# Patient Record
Sex: Male | Born: 1975 | Race: White | Hispanic: No | Marital: Single
Health system: Midwestern US, Academic
[De-identification: ages and names within clinical notes are randomized; demographics above are authoritative.]

## PROBLEM LIST (undated history)

## (undated) ENCOUNTER — Emergency Department (HOSPITAL_COMMUNITY): Source: Home / Self Care

## (undated) DIAGNOSIS — N200 Calculus of kidney: Secondary | ICD-10-CM

## (undated) DIAGNOSIS — K219 Gastro-esophageal reflux disease without esophagitis: Secondary | ICD-10-CM

## (undated) DIAGNOSIS — M722 Plantar fascial fibromatosis: Secondary | ICD-10-CM

## (undated) HISTORY — PX: SHOULDER SURGERY: SHX246

## (undated) HISTORY — PX: TONSILLECTOMY: SUR1361

## (undated) HISTORY — DX: Calculus of kidney: N20.0

## (undated) HISTORY — PX: CYSTOSCOPY: SHX5120

## (undated) HISTORY — DX: Gastro-esophageal reflux disease without esophagitis: K21.9

## (undated) HISTORY — DX: Plantar fascial fibromatosis: M72.2

---

## 2006-07-13 ENCOUNTER — Ambulatory Visit (HOSPITAL_COMMUNITY): Admission: RE | Admit: 2006-07-13 | Discharge: 2006-07-13 | Payer: Self-pay | Admitting: Family Medicine

## 2008-02-29 ENCOUNTER — Ambulatory Visit (HOSPITAL_COMMUNITY): Admission: RE | Admit: 2008-02-29 | Discharge: 2008-02-29 | Payer: Self-pay | Admitting: Family Medicine

## 2008-12-16 ENCOUNTER — Ambulatory Visit (HOSPITAL_COMMUNITY): Admission: RE | Admit: 2008-12-16 | Discharge: 2008-12-16 | Payer: Self-pay | Admitting: Family Medicine

## 2011-08-15 ENCOUNTER — Other Ambulatory Visit: Payer: Self-pay

## 2011-08-15 ENCOUNTER — Other Ambulatory Visit: Payer: Self-pay | Admitting: Sports Medicine

## 2011-08-15 DIAGNOSIS — M25522 Pain in left elbow: Secondary | ICD-10-CM

## 2018-09-05 ENCOUNTER — Other Ambulatory Visit: Payer: Self-pay | Admitting: Family Medicine

## 2018-09-05 DIAGNOSIS — M25521 Pain in right elbow: Secondary | ICD-10-CM

## 2018-09-06 ENCOUNTER — Ambulatory Visit
Admission: RE | Admit: 2018-09-06 | Discharge: 2018-09-06 | Disposition: A | Discharge status: Home / Self Care | Payer: Self-pay | Source: Ambulatory Visit | Attending: Family Medicine | Admitting: Family Medicine

## 2018-09-06 DIAGNOSIS — M25521 Pain in right elbow: Secondary | ICD-10-CM

## 2020-06-30 ENCOUNTER — Other Ambulatory Visit: Payer: Self-pay | Admitting: Physician Assistant

## 2020-06-30 ENCOUNTER — Ambulatory Visit (HOSPITAL_COMMUNITY): Payer: Self-pay

## 2020-06-30 ENCOUNTER — Other Ambulatory Visit (HOSPITAL_COMMUNITY): Payer: Self-pay | Admitting: Physician Assistant

## 2020-06-30 DIAGNOSIS — N50811 Right testicular pain: Secondary | ICD-10-CM

## 2020-07-01 ENCOUNTER — Ambulatory Visit (HOSPITAL_COMMUNITY)
Admission: RE | Admit: 2020-07-01 | Discharge: 2020-07-01 | Disposition: A | Discharge status: Home / Self Care | Payer: BC Managed Care – PPO | Source: Ambulatory Visit | Attending: Physician Assistant | Admitting: Physician Assistant

## 2020-07-01 ENCOUNTER — Other Ambulatory Visit: Payer: Self-pay

## 2020-07-01 DIAGNOSIS — N50811 Right testicular pain: Secondary | ICD-10-CM | POA: Insufficient documentation

## 2020-08-05 ENCOUNTER — Ambulatory Visit: Payer: BC Managed Care – PPO | Admitting: Urology

## 2020-08-17 ENCOUNTER — Other Ambulatory Visit: Payer: Self-pay | Admitting: Family Medicine

## 2020-08-17 DIAGNOSIS — R1031 Right lower quadrant pain: Secondary | ICD-10-CM

## 2020-08-20 ENCOUNTER — Ambulatory Visit
Admission: RE | Admit: 2020-08-20 | Discharge: 2020-08-20 | Disposition: A | Discharge status: Home / Self Care | Payer: BC Managed Care – PPO | Source: Ambulatory Visit | Attending: Family Medicine | Admitting: Family Medicine

## 2020-08-20 ENCOUNTER — Other Ambulatory Visit: Payer: Self-pay

## 2020-08-20 DIAGNOSIS — R1031 Right lower quadrant pain: Secondary | ICD-10-CM

## 2020-08-20 MED ORDER — IOPAMIDOL (ISOVUE-300) INJECTION 61%
100.0000 mL | Freq: Once | INTRAVENOUS | Status: AC | PRN
  Administered 2020-08-20: 100 mL via INTRAVENOUS

## 2020-09-09 ENCOUNTER — Other Ambulatory Visit: Payer: Self-pay | Admitting: Family Medicine

## 2020-10-03 ENCOUNTER — Encounter: Admit: 2020-10-03 | Discharge: 2020-10-03 | Payer: PRIVATE HEALTH INSURANCE

## 2020-10-03 ENCOUNTER — Ambulatory Visit: Admit: 2020-10-03 | Discharge: 2020-10-04 | Payer: PRIVATE HEALTH INSURANCE

## 2020-10-03 DIAGNOSIS — S61411A Laceration without foreign body of right hand, initial encounter: Secondary | ICD-10-CM

## 2020-10-03 MED ORDER — AMOXICILLIN-POT CLAVULANATE 875-125 MG PO TAB
1 | ORAL_TABLET | Freq: Two times a day (BID) | ORAL | 0 refills | 7.00000 days | Status: AC
Start: 2020-10-03 — End: ?

## 2020-10-03 MED ORDER — AMOXICILLIN-POT CLAVULANATE 875-125 MG PO TAB
1 | ORAL_TABLET | Freq: Two times a day (BID) | ORAL | 0 refills | 7.00000 days | Status: DC
Start: 2020-10-03 — End: 2020-10-03
  Filled 2020-10-03: qty 20, 10d supply, fill #1

## 2020-10-03 NOTE — Progress Notes
Date of Service: 10/03/2020    Michael Ayala is a 45 y.o. male.  DOB: January 25, 1976  MRN: [id number]     Subjective:             Pt is here today for a laceration to the right hand.  He reports his wine glass broke in his hand last night around 2300.  It caused a laceration to the palmer surface of his right hand over the 5th MCP joint.  Pt is not sure of his las Td.  He treated with a bandage, at it was doing well, but due to physical job, he noted that the cut was not staying together and decided to have it looked at.             Review of Systems   Constitutional: Negative for chills, fatigue and fever.   Respiratory: Negative for cough and chest tightness.    Cardiovascular: Negative for chest pain.   Skin: Positive for wound.         Objective:         No current outpatient medications on file.     Vitals:    10/03/20 1428   BP: 130/80   Pulse: 101   Resp: 14   Temp: 36.8 ?C (98.2 ?F)   SpO2: 98%   Weight: 80 kg (176 lb 6.4 oz)   Height: 180.3 cm (5' 11)   PainSc: Zero     Body mass index is 24.6 kg/m?Marland Kitchen     Physical Exam  Vitals and nursing note reviewed.   Constitutional:       General: He is not in acute distress.     Appearance: Normal appearance. He is normal weight. He is not ill-appearing, toxic-appearing or diaphoretic.   HENT:      Head: Normocephalic and atraumatic.   Pulmonary:      Effort: Pulmonary effort is normal.   Skin:     Capillary Refill: Capillary refill takes less than 2 seconds.      Findings: Laceration present.      Comments: Laceration is in a V shape and about 1 cm on both sides.     Neurological:      Mental Status: He is alert and oriented to person, place, and time.   Psychiatric:         Mood and Affect: Mood normal.         Behavior: Behavior normal.         Thought Content: Thought content normal.         Judgment: Judgment normal.       Discussed with pt that because the laceration is over 12 hour old it cannot be sutured.  However, due to the skin continuing to pull apart with activity, 1 sterri strip placed over the laceration. This was after the area was cleaned with hibiclens.  Pt also given a Td injection.  Due to pt currently traveling and not near a PCP, wait and see antibiotics ordered.  Pt advised to start if he develops signs of infection.  Pt to clean the area BID with soap and water.        Assessment and Plan:  Michael Ayala was seen today for hand laceration.    Diagnoses and all orders for this visit:    Laceration of right hand without foreign body, initial encounter    Other orders  -     TD VACCINE PF >=7YO (DECAVAC, TENIVAC)  -  Discontinue: amoxicillin-potassium clavulanate (AUGMENTIN) 875/125 mg tablet; Take one tablet by mouth every 12 hours. Take with food.  -     amoxicillin-potassium clavulanate (AUGMENTIN) 875/125 mg tablet; Take one tablet by mouth every 12 hours. Take with food.      Patient Instructions     Laceration, All Closures  A?laceration is a cut through the skin. This will usually require stitches or staples if it's deep. Minor cuts may be treated with a surgical tape closure or?skin glue.     Home care  ? Your healthcare provider may prescribe an antibiotic. This is to help prevent infection. Follow all instructions for taking this medicine. Take the medicine every day until it's gone or you are told to stop. You should not have any left over.  ? The healthcare provider may prescribe medicines for pain. If no pain medicines were prescribed, you can use over-the-counter pain medicines. Follow instructions for taking any pain medicines. Talk with your healthcare provider before using these medicines if you have chronic liver or kidney disease, or ever had a stomach ulcer or digestive bleeding.  ? Follow the healthcare provider?s instructions on how to care for the cut.  ? Keep the wound clean and dry. Don't get the wound wet until you are told it's OK to do so.?If the area gets wet, gently pat it dry with a clean cloth. Replace the wet bandage with a dry one.  ? If a bandage was applied and it becomes wet or dirty, replace it. Otherwise, leave it in place for the first 24 hours.  ? Caring for stitches or staples: Once you no longer need to keep them dry, clean the wound daily. First, remove the bandage. Then wash the area gently with soap and clean running water, or as directed by the?healthcare provider. Use a wet cotton swab to loosen and remove any blood or crust that forms. After cleaning, apply a thin layer of antibiotic ointment if advised. Then put on a new bandage unless you are told not to.  ? Caring for skin glue: Don?t put apply liquid, ointment, or cream on the wound while the glue is in place.?Don't do activities that cause heavy sweating. Protect the wound from sunlight.?Don't scratch, rub, or pick at the adhesive film. Don't place tape directly over the film.?The glue should peel off naturally within 5 to 10 days.?  ? Caring for surgical tape: Keep the area dry. If it gets wet, blot it dry with a clean towel. Surgical tape usually falls off within 7 to 10 days. If it has not fallen off after 10 days, you can take it off yourself. Put mineral oil or petroleum jelly on a cotton ball and gently rub the tape until it's removed.  ? Once you can get the wound wet, you may shower as usual but don't soak the wound in water (no tub baths or swimming).  ? Even with proper treatment, a wound infection may sometimes occur. Check the wound daily for signs of infection listed below.  Scalp wounds  During the first 2 days, you may carefully rinse your hair in the shower to remove blood, glass or dirt particles. After 2 days, you may shower and shampoo your hair normally. Don't soak your scalp in the tub or go swimming until the stitches or staples have been removed. Talk with your healthcare provider before applying any antibiotic ointment to the wound.   Mouth wounds  Eat soft foods to reduce pain. If the cut  is inside of your mouth, clean by rinsing after each meal and at bedtime with a mixture of equal parts water and hydrogen peroxide (don't swallow!). Or you can use a cotton swab to directly apply hydrogen peroxide onto the cut. You may also be prescribed a chlorhexidine solution to rinse with. Mouth wounds can be painful when eating. You may use an over-the-counter local numbing solution for pain relief. If this is not available, you may use any numbing solution intended for teething babies. You may apply this directly to the sores with a cotton-tip swab or with your clean finger.   Follow-up care  Follow up with your healthcare provider as advised.?Ask your healthcare provider how long stitches should be left in place. Be sure to return for stitch removal as directed. If dissolving stitches were used in the mouth, these should fall out or dissolve without the need for removal. If tape closures were used, remove them yourself when your provider recommends if they have not fallen off on their own. If?skin glue was used, the film will wear off by itself. Generally, you should keep healing wounds out of direct sunlight for the first couple of months to try to lessen scarring.   When to seek medical advice  Call your healthcare provider right away?if any of these occur:  ? Signs of infection, including increasing pain in the wound, increasing wound redness or swelling, or pus or bad odor coming from the wound  ? Fever of?100.4?F (38.?C)?or higher , or as directed by your healthcare provider  ? Stitches or staples come apart or fall out or surgical tape falls off before 7 days and the wound appears to be reopening  ? Wound edges reopen  ? Wound changes colors  ? Numbness around the wound after any numbing medicine should have worn off  ? Decreased movement around the injured area  Call 911  Call 911?if you can't control the wound bleeding?with direct pressure.   [employer] last reviewed this educational content on 03/08/2018  ? 2000-2021 [employer], [place]. All rights reserved. This information is not intended as a substitute for professional medical care. Always follow your healthcare professional's instructions.

## 2020-10-03 NOTE — Patient Instructions
Laceration, All Closures  Alaceration is a cut through the skin. This will usually require stitches or staples if it's deep. Minor cuts may be treated with a surgical tape closure orskin glue.     Home care   Your healthcare provider may prescribe an antibiotic. This is to help prevent infection. Follow all instructions for taking this medicine. Take the medicine every day until it's gone or you are told to stop. You should not have any left over.   The healthcare provider may prescribe medicines for pain. If no pain medicines were prescribed, you can use over-the-counter pain medicines. Follow instructions for taking any pain medicines. Talk with your healthcare provider before using these medicines if you have chronic liver or kidney disease, or ever had a stomach ulcer or digestive bleeding.   Follow the healthcare provider's instructions on how to care for the cut.   Keep the wound clean and dry. Don't get the wound wet until you are told it's OK to do so.If the area gets wet, gently pat it dry with a clean cloth. Replace the wet bandage with a dry one.   If a bandage was applied and it becomes wet or dirty, replace it. Otherwise, leave it in place for the first 24 hours.   Caring for stitches or staples: Once you no longer need to keep them dry, clean the wound daily. First, remove the bandage. Then wash the area gently with soap and clean running water, or as directed by thehealthcare provider. Use a wet cotton swab to loosen and remove any blood or crust that forms. After cleaning, apply a thin layer of antibiotic ointment if advised. Then put on a new bandage unless you are told not to.   Caring for skin glue: Don't put apply liquid, ointment, or cream on the wound while the glue is in place.Don't do activities that cause heavy sweating. Protect the wound from sunlight.Don't scratch, rub, or pick at the adhesive film. Don't place tape directly over the film.The glue should peel off naturally  within 5 to 10 days.   Caring for surgical tape: Keep the area dry. If it gets wet, blot it dry with a clean towel. Surgical tape usually falls off within 7 to 10 days. If it has not fallen off after 10 days, you can take it off yourself. Put mineral oil or petroleum jelly on a cotton ball and gently rub the tape until it's removed.   Once you can get the wound wet, you may shower as usual but don't soak the wound in water (no tub baths or swimming).   Even with proper treatment, a wound infection may sometimes occur. Check the wound daily for signs of infection listed below.  Scalp wounds  During the first 2 days, you may carefully rinse your hair in the shower to remove blood, glass or dirt particles. After 2 days, you may shower and shampoo your hair normally. Don't soak your scalp in the tub or go swimming until the stitches or staples have been removed. Talk with your healthcare provider before applying any antibiotic ointment to the wound.   Mouth wounds  Eat soft foods to reduce pain. If the cut is inside of your mouth, clean by rinsing after each meal and at bedtime with a mixture of equal parts water and hydrogen peroxide (don't swallow!). Or you can use a cotton swab to directly apply hydrogen peroxide onto the cut. You may also be prescribed a chlorhexidine solution to rinse   with. Mouth wounds can be painful when eating. You may use an over-the-counter local numbing solution for pain relief. If this is not available, you may use any numbing solution intended for teething babies. You may apply this directly to the sores with a cotton-tip swab or with your clean finger.   Follow-up care  Follow up with your healthcare provider as advised.Ask your healthcare provider how long stitches should be left in place. Be sure to return for stitch removal as directed. If dissolving stitches were used in the mouth, these should fall out or dissolve without the need for removal. If tape closures were used, remove  them yourself when your provider recommends if they have not fallen off on their own. Ifskin glue was used, the film will wear off by itself. Generally, you should keep healing wounds out of direct sunlight for the first couple of months to try to lessen scarring.   When to seek medical advice  Call your healthcare provider right awayif any of these occur:   Signs of infection, including increasing pain in the wound, increasing wound redness or swelling, or pus or bad odor coming from the wound   Fever of100.4F (38.C)or higher , or as directed by your healthcare provider   Stitches or staples come apart or fall out or surgical tape falls off before 7 days and the wound appears to be reopening   Wound edges reopen   Wound changes colors   Numbness around the wound after any numbing medicine should have worn off   Decreased movement around the injured area  Call 911  Call 911if you can't control the wound bleedingwith direct pressure.   [employer] last reviewed this educational content on 03/08/2018   2000-2021 [employer]. All rights reserved. This information is not intended as a substitute for professional medical care. Always follow your healthcare professional's instructions.

## 2020-11-05 ENCOUNTER — Other Ambulatory Visit: Payer: Self-pay

## 2020-11-05 ENCOUNTER — Ambulatory Visit: Payer: BC Managed Care – PPO | Admitting: Urology

## 2020-11-05 ENCOUNTER — Encounter: Payer: Self-pay | Admitting: Urology

## 2020-11-05 VITALS — BP 131/91 | HR 97 | Wt 220.0 lb

## 2020-11-05 DIAGNOSIS — Z87448 Personal history of other diseases of urinary system: Secondary | ICD-10-CM | POA: Diagnosis not present

## 2020-11-05 DIAGNOSIS — N5082 Scrotal pain: Secondary | ICD-10-CM | POA: Diagnosis not present

## 2020-11-05 DIAGNOSIS — Z87898 Personal history of other specified conditions: Secondary | ICD-10-CM

## 2020-11-05 DIAGNOSIS — N50819 Testicular pain, unspecified: Secondary | ICD-10-CM

## 2020-11-05 LAB — URINALYSIS, COMPLETE
Bilirubin, UA: NEGATIVE
Glucose, UA: NEGATIVE
Ketones, UA: NEGATIVE
Leukocytes,UA: NEGATIVE
Nitrite, UA: NEGATIVE
Protein,UA: NEGATIVE
RBC, UA: NEGATIVE
Specific Gravity, UA: 1.015 (ref 1.005–1.030)
Urobilinogen, Ur: 0.2 mg/dL (ref 0.2–1.0)
pH, UA: 7 (ref 5.0–7.5)

## 2020-11-05 LAB — MICROSCOPIC EXAMINATION
Bacteria, UA: NONE SEEN
Epithelial Cells (non renal): NONE SEEN /hpf (ref 0–10)
RBC: NONE SEEN /hpf (ref 0–2)

## 2020-11-05 MED ORDER — TAMSULOSIN HCL 0.4 MG PO CAPS: 0.4000 mg | ORAL_CAPSULE | Freq: Every day | ORAL | 0 refills | Status: DC

## 2020-11-06 ENCOUNTER — Encounter: Payer: Self-pay | Admitting: Urology

## 2020-11-06 NOTE — Progress Notes (Signed)
11/05/2020 7:25 AM   Juan Murphy March 13, 1976 [id number]  Referring provider: Curlene Labrum, MD [place],  [place] [address]  Chief Complaint  Patient presents with  . Testicle Pain    HPI: Juan Murphy is a 45 y.o. male referred for a second opinion of chronic right hemiscrotal pain.   Initially saw Dr. Luetta Nutting at [facility] in [facility] 09/10/2020 with an ~ 1 year history of intermittent right hemiscrotal pain  Prior scrotal ultrasound November 2021 showed no intratesticular abnormalities and a small right epididymal cyst (2 mm)  CT performed January 2022 showed a nonobstructing 7 mm left lower pole calculus  Urinalysis was unremarkable and he was treated with an empiric 10-day course of Levaquin and Voltaren/hydrocodone  Mild improvement in symptoms on follow-up and amitriptyline 25 mg daily was added  He continues to have intermittent pain which typically is worse in the evening and he has difficulty sleeping he complains of frequency, urgency.  He did have an episode of gross hematuria approximately 3 weeks before seeing Dr. Luetta Nutting  His most bothersome symptom at present is loss of sleep due to his urinary symptoms and pain  No side effects with the amitriptyline   PMH: History reviewed. No pertinent past medical history.  Surgical History: History reviewed. No pertinent surgical history.  Home Medications:  Allergies as of 11/05/2020   No Known Allergies     Medication List       Accurate as of November 05, 2020 11:59 PM. If you have any questions, ask your nurse or doctor.        tamsulosin 0.4 MG Caps capsule Commonly known as: FLOMAX Take 1 capsule (0.4 mg total) by mouth daily. Started by: Abbie Sons, MD       Allergies: No Known Allergies  Family History: History reviewed. No pertinent family history.  Social History:  reports that he has never smoked. He has never used smokeless tobacco. No history on  file for alcohol use and drug use.   Physical Exam: BP (!) 131/91   Pulse 97   Wt 220 lb (99.8 kg)   Constitutional:  Alert and oriented, No acute distress. HEENT: [place] AT, moist mucus membranes.  Trachea midline, no masses. Cardiovascular: No clubbing, cyanosis, or edema. Respiratory: Normal respiratory effort, no increased work of breathing. GI: Abdomen is soft, nontender, nondistended, no abdominal masses GU: Phallus without lesions, testes descended bilaterally without masses or tenderness.  Spermatic cord/epididymis palpably normal bilaterally.  Prostate 30 g moderately tender and significant pelvic floor tenderness Skin: No rashes, bruises or suspicious lesions. Neurologic: Grossly intact, no focal deficits, moving all 4 extremities. Psychiatric: Normal mood and affect.   Pertinent Imaging: Ultrasound and CT images were personally reviewed and interpreted   Assessment & Plan:    1.  Chronic right hemiscrotal pain  Pain is associated with bothersome lower urinary tract symptoms  Significant pelvic floor tenderness  We discussed possibility of referred pain due to prostatic inflammation and pelvic floor abnormalities  We will add tamsulosin 0.4 mg daily  Increase amitriptyline to 50 mg x 1 week to see if pain/sleep improves  If no improvement in approximately 2 weeks he was instructed to call back and would recommend referral for pelvic floor physical therapy  2.  History gross hematuria  Recommend cystoscopy to assess for stricture or other potential causes of referred testicular pain in addition to a complete lower tract evaluation for other etiologies of hematuria  Abbie Sons, [place] [address] [place], [place] [address] [phone]

## 2021-07-23 IMAGING — US US SCROTUM W/ DOPPLER COMPLETE
1 series · 14 of 25 positions shown · non-contrast
Comparison: Prior exam on time line of 08/10/2018 is not available
for comparison

CLINICAL DATA: RIGHT testicular pain for 2 weeks

EXAM:
SCROTAL ULTRASOUND
DOPPLER ULTRASOUND OF THE TESTICLES
TECHNIQUE: Complete ultrasound examination of the testicles, epididymis, and
other scrotal structures was performed. Color and spectral Doppler
ultrasound were also utilized to evaluate blood flow to the
testicles.

[Series 1: us scrotum w/doppler · 14 of 66 slices shown]
[im 1/66]
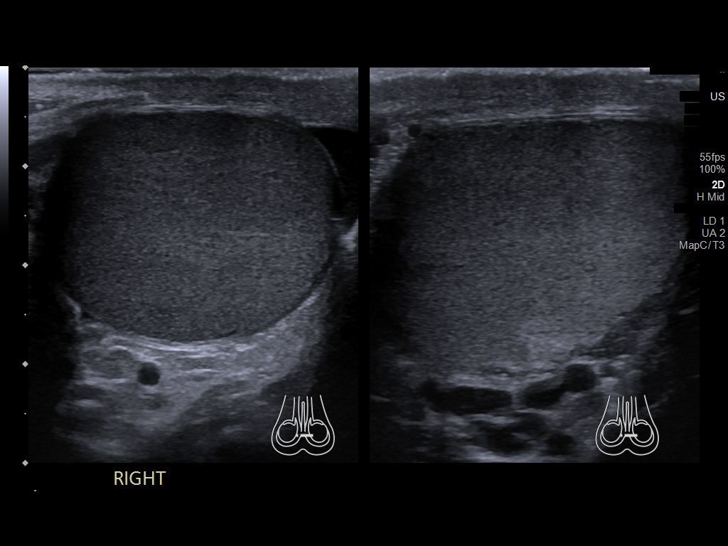
[im 6/66]
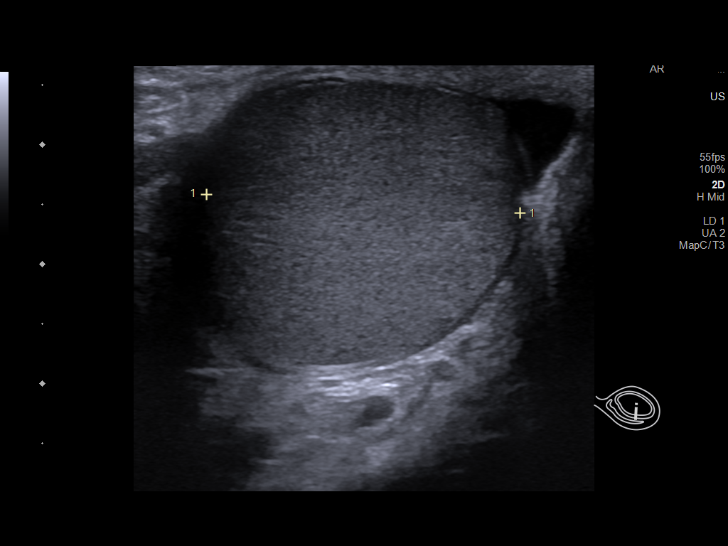
[im 11/66]
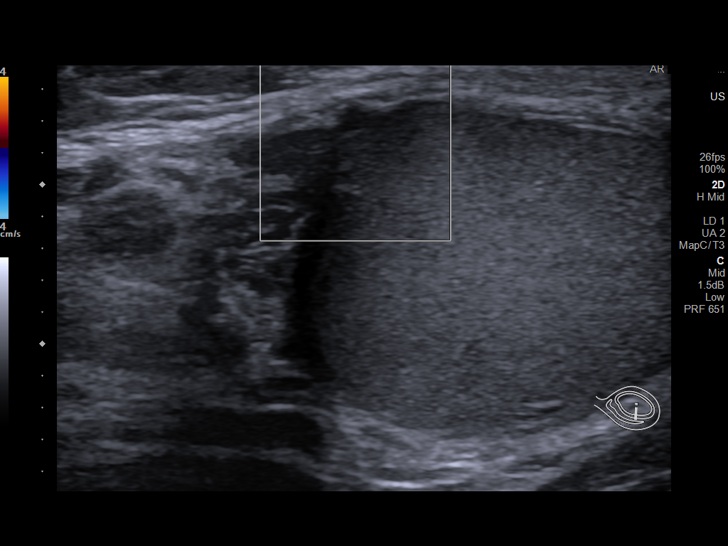
[im 17/66]
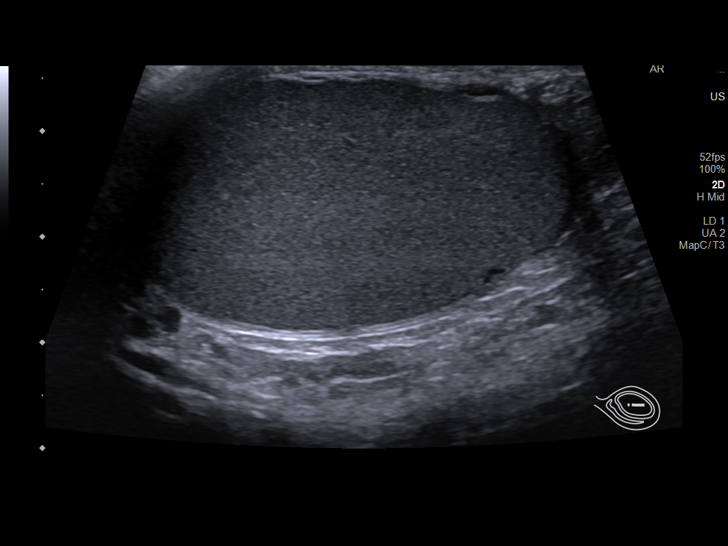
[im 22/66]
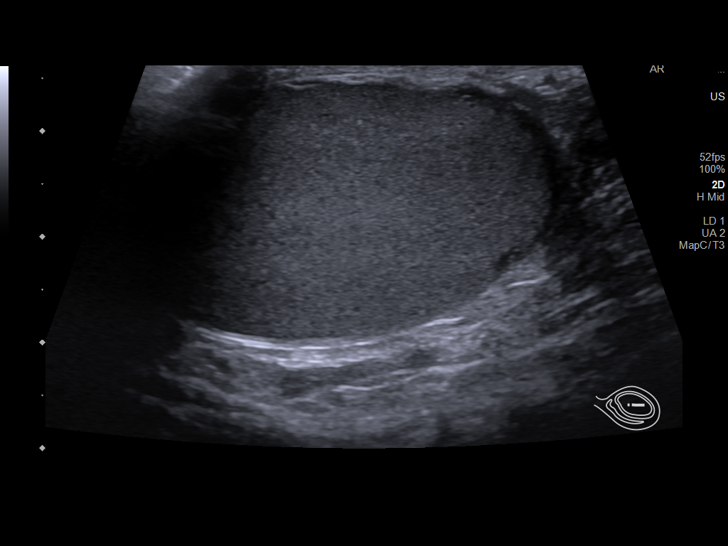
[im 25/66]
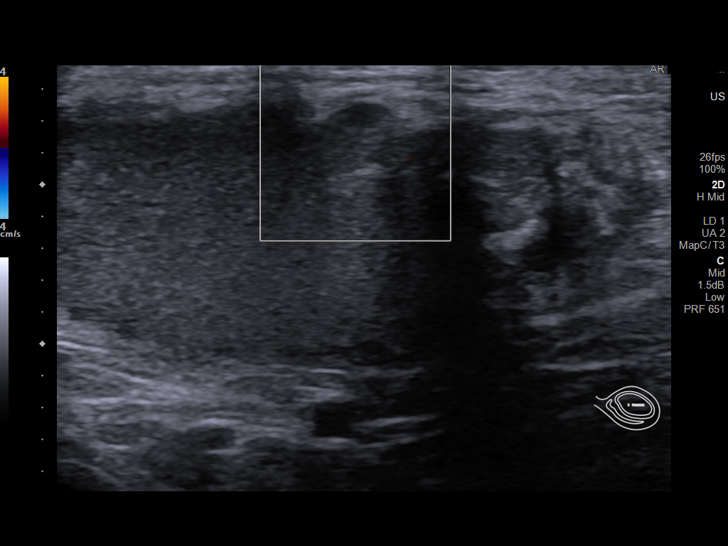
[im 30/66]
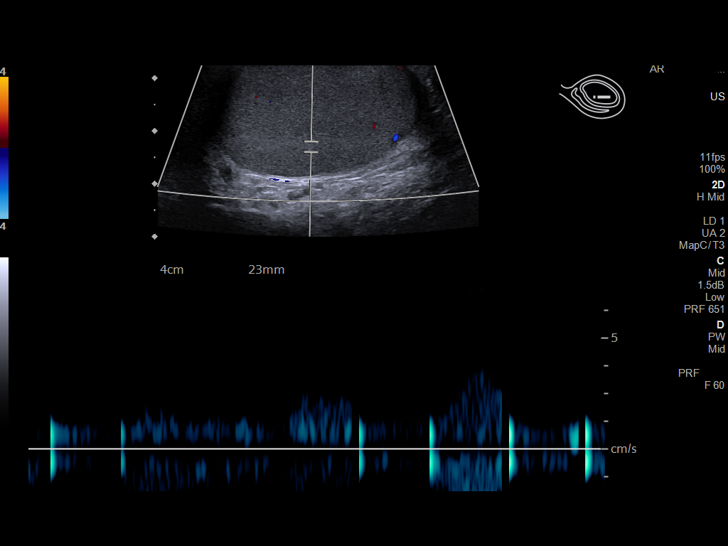
[im 36/66]
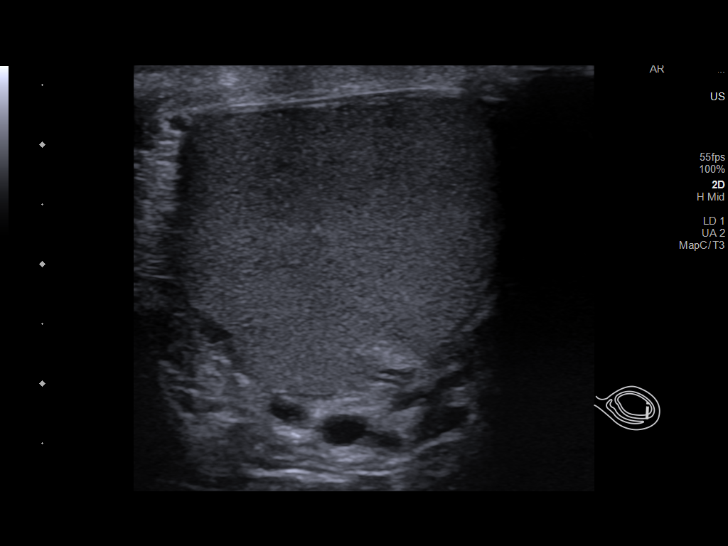
[im 41/66]
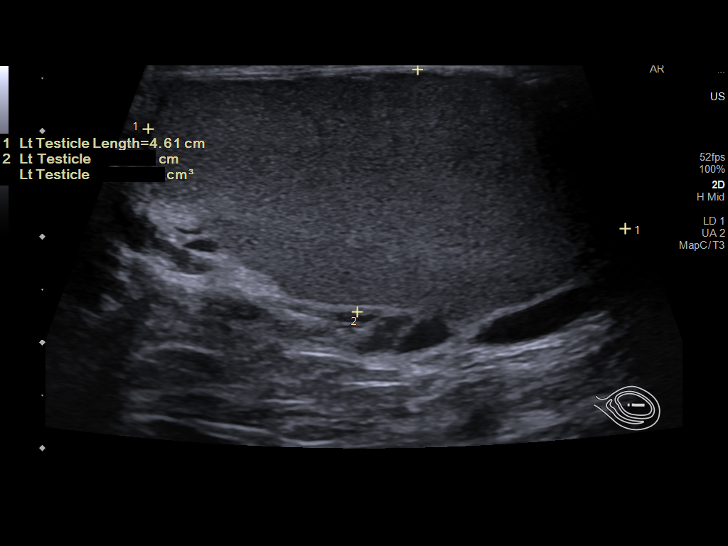
[im 44/66]
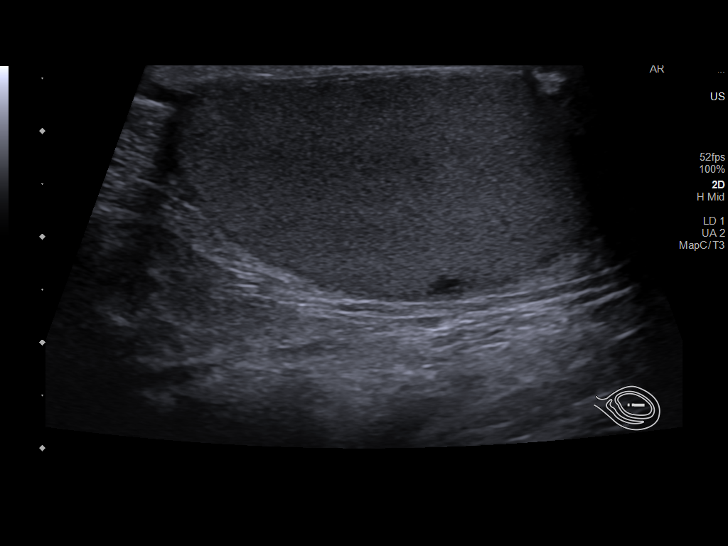
[im 49/66]
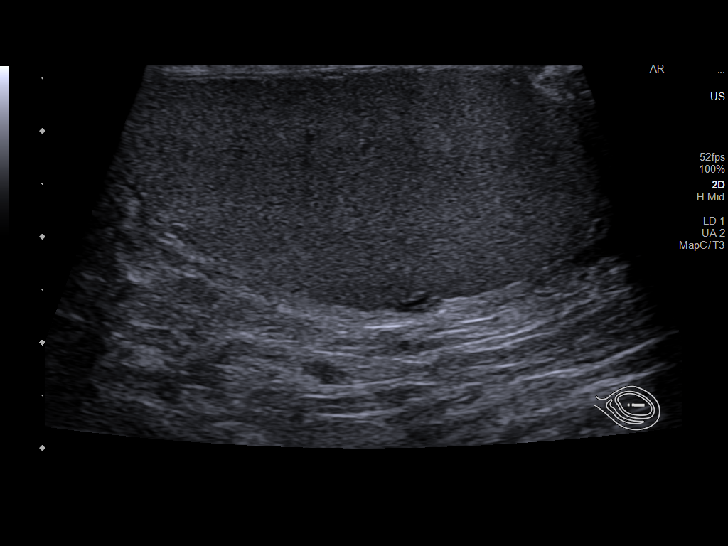
[im 55/66]
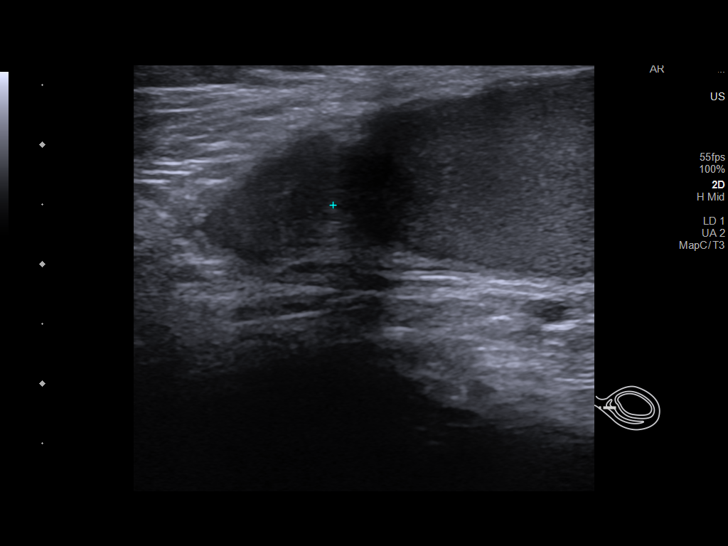
[im 60/66]
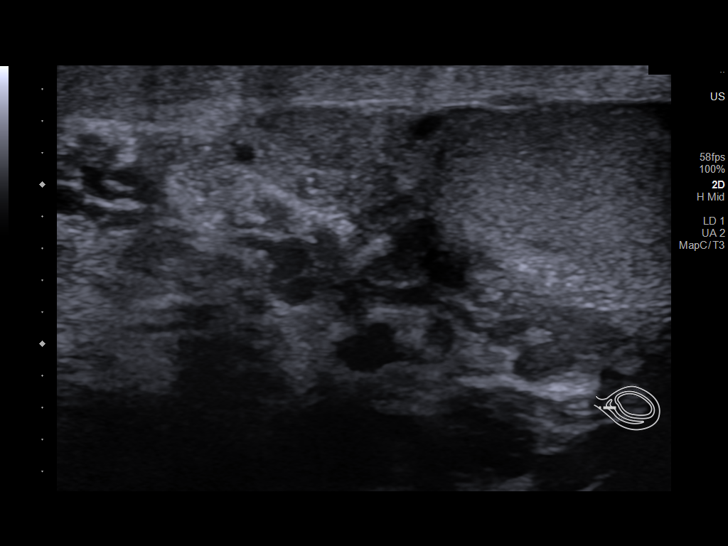
[im 66/66]
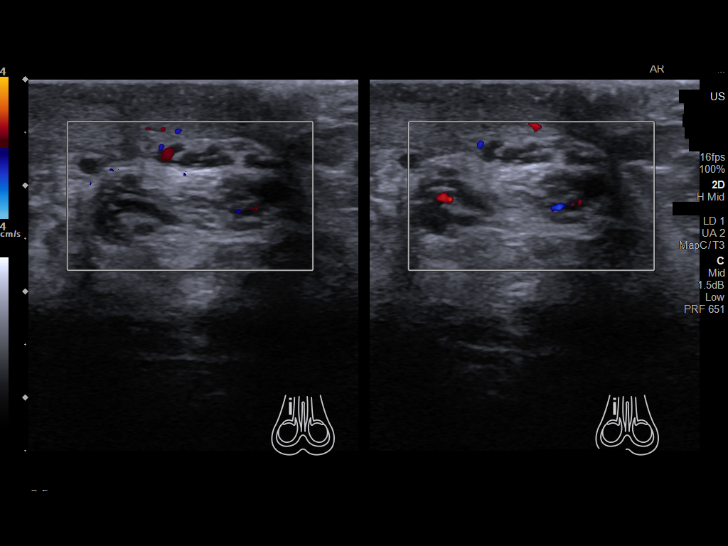

[14 of 25 positions shown; findings below may reference images not displayed]

FINDINGS: Right testicle

Measurements: 4.1 x 2.4 x 2.6 cm. Normal echogenicity without mass
or calcification. Internal blood flow present on color Doppler
imaging.

Left testicle

Measurements: 4.6 x 2.4 x 2.8 cm. Normal echogenicity without mass
or calcification. Internal blood flow present on color Doppler
imaging.

Right epididymis:  Normal in size and appearance.

Left epididymis:  2 mm cyst at LEFT epididymal head

Hydrocele:  Trace RIGHT hydrocele.  No LEFT hydrocele.

Varicocele:  None visualized.

Pulsed Doppler interrogation of both testes demonstrates normal low
resistance arterial and venous waveforms bilaterally.
IMPRESSION: No significant scrotal sonographic abnormalities.

## 2021-11-02 ENCOUNTER — Encounter (INDEPENDENT_AMBULATORY_CARE_PROVIDER_SITE_OTHER): Payer: Self-pay | Admitting: *Deleted

## 2022-01-12 ENCOUNTER — Encounter (INDEPENDENT_AMBULATORY_CARE_PROVIDER_SITE_OTHER): Payer: Self-pay | Admitting: *Deleted

## 2022-01-31 ENCOUNTER — Ambulatory Visit (INDEPENDENT_AMBULATORY_CARE_PROVIDER_SITE_OTHER): Payer: BC Managed Care – PPO | Admitting: Gastroenterology

## 2022-03-08 ENCOUNTER — Encounter: Payer: Self-pay | Admitting: Urology

## 2022-03-08 ENCOUNTER — Ambulatory Visit (HOSPITAL_COMMUNITY)
Admission: RE | Admit: 2022-03-08 | Discharge: 2022-03-08 | Disposition: A | Discharge status: Home / Self Care | Payer: BC Managed Care – PPO | Source: Ambulatory Visit | Attending: Urology | Admitting: Urology

## 2022-03-08 ENCOUNTER — Ambulatory Visit (INDEPENDENT_AMBULATORY_CARE_PROVIDER_SITE_OTHER): Payer: BC Managed Care – PPO | Admitting: Urology

## 2022-03-08 VITALS — BP 137/98 | HR 93 | Ht 71.0 in | Wt 210.0 lb

## 2022-03-08 DIAGNOSIS — E291 Testicular hypofunction: Secondary | ICD-10-CM

## 2022-03-08 DIAGNOSIS — R351 Nocturia: Secondary | ICD-10-CM | POA: Diagnosis not present

## 2022-03-08 DIAGNOSIS — N200 Calculus of kidney: Secondary | ICD-10-CM | POA: Diagnosis not present

## 2022-03-08 LAB — URINALYSIS, ROUTINE W REFLEX MICROSCOPIC
Bilirubin, UA: NEGATIVE
Glucose, UA: NEGATIVE
Ketones, UA: NEGATIVE
Leukocytes,UA: NEGATIVE
Nitrite, UA: NEGATIVE
Protein,UA: NEGATIVE
RBC, UA: NEGATIVE
Specific Gravity, UA: 1.015 (ref 1.005–1.030)
Urobilinogen, Ur: 0.2 mg/dL (ref 0.2–1.0)
pH, UA: 8.5 — ABNORMAL HIGH (ref 5.0–7.5)

## 2022-03-08 MED ORDER — TAMSULOSIN HCL 0.4 MG PO CAPS: 0.4000 mg | ORAL_CAPSULE | Freq: Every day | ORAL | 11 refills | Status: DC

## 2022-03-08 NOTE — Patient Instructions (Signed)

## 2022-03-08 NOTE — Progress Notes (Signed)
03/08/2022 10:55 AM   Juan Murphy [birth year] [id number]  Referring provider: Curlene Labrum, MD [place],  [place] [address]  nephrolithiasis   HPI: Juan Murphy is a 46yo here for evaluation of nephrolithiasis. He underwent lithotripsy in 11/2021 at [facility].. He has a pending 24 hour urine with [facility]. No prior stone labs. He has fatigue and had a testosterone level which is 242. He did '200mg'$  IM 10 days ago. IPSS 17 QOL 3.  No other complaints today   PMH: No past medical history on file.  Surgical History: No past surgical history on file.  Home Medications:  Allergies as of 03/08/2022   No Known Allergies      Medication List        Accurate as of March 08, 2022 10:55 AM. If you have any questions, ask your nurse or doctor.          meloxicam 15 MG tablet Commonly known as: MOBIC Take 15 mg by mouth daily.   omeprazole 20 MG capsule Commonly known as: PRILOSEC Take 20 mg by mouth daily.   tamsulosin 0.4 MG Caps capsule Commonly known as: FLOMAX Take 1 capsule (0.4 mg total) by mouth daily.        Allergies: No Known Allergies  Family History: No family history on file.  Social History:  reports that he has never smoked. He has never used smokeless tobacco. No history on file for alcohol use and drug use.  ROS: All other review of systems were reviewed and are negative except what is noted above in HPI  Physical Exam: BP (!) 137/98   Pulse 93   Ht '5\' 11"'$  (1.803 m)   Wt 210 lb (95.3 kg)   BMI 29.29 kg/m   Constitutional:  Alert and oriented, No acute distress. HEENT: [place] AT, moist mucus membranes.  Trachea midline, no masses. Cardiovascular: No clubbing, cyanosis, or edema. Respiratory: Normal respiratory effort, no increased work of breathing. GI: Abdomen is soft, nontender, nondistended, no abdominal masses GU: No CVA tenderness.  Lymph: No cervical or inguinal lymphadenopathy. Skin: No rashes, bruises or suspicious  lesions. Neurologic: Grossly intact, no focal deficits, moving all 4 extremities. Psychiatric: Normal mood and affect.  Laboratory Data: No results found for: "WBC", "HGB", "HCT", "MCV", "PLT"  No results found for: "CREATININE"  No results found for: "PSA"  No results found for: "TESTOSTERONE"  No results found for: "HGBA1C"  Urinalysis    Component Value Date/Time   APPEARANCEUR Clear 11/05/2020 1443   GLUCOSEU Negative 11/05/2020 1443   BILIRUBINUR Negative 11/05/2020 1443   PROTEINUR Negative 11/05/2020 1443   NITRITE Negative 11/05/2020 1443   LEUKOCYTESUR Negative 11/05/2020 1443    Lab Results  Component Value Date   LABMICR See below: 11/05/2020   WBCUA 0-5 11/05/2020   LABEPIT None seen 11/05/2020   BACTERIA None seen 11/05/2020    Pertinent Imaging:  No results found for this or any previous visit.  No results found for this or any previous visit.  No results found for this or any previous visit.  No results found for this or any previous visit.  No results found for this or any previous visit.  No results found for this or any previous visit.  No results found for this or any previous visit.  No results found for this or any previous visit.   Assessment & Plan:    1. Kidney stones KUB - Urinalysis, Routine w reflex microscopic  2. Hypogonadism  male -Testosterone labs  3. Nocturia -restart flomax 0.'4mg'$  daily    No follow-ups on file.  Nicolette Bang, MD  [facility] [place]

## 2022-03-09 LAB — PTH, INTACT AND CALCIUM
Calcium: 9.4 mg/dL (ref 8.7–10.2)
PTH: 21 pg/mL (ref 15–65)

## 2022-03-14 LAB — CBC WITH DIFFERENTIAL
Basophils Absolute: 0.1 10*3/uL (ref 0.0–0.2)
Basos: 1 %
EOS (ABSOLUTE): 0.3 10*3/uL (ref 0.0–0.4)
Eos: 5 %
Hematocrit: 46.8 % (ref 37.5–51.0)
Hemoglobin: 15.7 g/dL (ref 13.0–17.7)
Immature Grans (Abs): 0 10*3/uL (ref 0.0–0.1)
Immature Granulocytes: 1 %
Lymphocytes Absolute: 1.7 10*3/uL (ref 0.7–3.1)
Lymphs: 25 %
MCH: 29.1 pg (ref 26.6–33.0)
MCHC: 33.5 g/dL (ref 31.5–35.7)
MCV: 87 fL (ref 79–97)
Monocytes Absolute: 0.9 10*3/uL (ref 0.1–0.9)
Monocytes: 12 %
Neutrophils Absolute: 4 10*3/uL (ref 1.4–7.0)
Neutrophils: 56 %
RBC: 5.39 x10E6/uL (ref 4.14–5.80)
RDW: 13.8 % (ref 11.6–15.4)
WBC: 7 10*3/uL (ref 3.4–10.8)

## 2022-03-14 LAB — COMPREHENSIVE METABOLIC PANEL
ALT: 29 IU/L (ref 0–44)
AST: 19 IU/L (ref 0–40)
Albumin/Globulin Ratio: 2 (ref 1.2–2.2)
Albumin: 4.6 g/dL (ref 4.1–5.1)
Alkaline Phosphatase: 64 IU/L (ref 44–121)
BUN/Creatinine Ratio: 10 (ref 9–20)
BUN: 9 mg/dL (ref 6–24)
Bilirubin Total: 0.5 mg/dL (ref 0.0–1.2)
CO2: 25 mmol/L (ref 20–29)
Calcium: 9.6 mg/dL (ref 8.7–10.2)
Chloride: 100 mmol/L (ref 96–106)
Creatinine, Ser: 0.93 mg/dL (ref 0.76–1.27)
Globulin, Total: 2.3 g/dL (ref 1.5–4.5)
Glucose: 87 mg/dL (ref 70–99)
Potassium: 4.6 mmol/L (ref 3.5–5.2)
Sodium: 139 mmol/L (ref 134–144)
Total Protein: 6.9 g/dL (ref 6.0–8.5)
eGFR: 103 mL/min/{1.73_m2} (ref >59)

## 2022-03-14 LAB — ESTRADIOL: Estradiol: 36.7 pg/mL (ref 7.6–42.6)

## 2022-03-14 LAB — B12 AND FOLATE PANEL
Folate: 10 ng/mL (ref >3.0)
Vitamin B-12: 747 pg/mL (ref 232–1245)

## 2022-03-14 LAB — LUTEINIZING HORMONE: LH: <0.3 m[IU]/mL — ABNORMAL LOW (ref 1.7–8.6)

## 2022-03-14 LAB — URIC ACID: Uric Acid: 5.4 mg/dL (ref 3.8–8.4)

## 2022-03-14 LAB — TSH: TSH: 2.37 u[IU]/mL (ref 0.450–4.500)

## 2022-03-14 LAB — PROLACTIN: Prolactin: 22.5 ng/mL — ABNORMAL HIGH (ref 4.0–15.2)

## 2022-03-14 LAB — TESTOSTERONE,FREE AND TOTAL
Testosterone, Free: 17.4 pg/mL (ref 6.8–21.5)
Testosterone: 459 ng/dL (ref 264–916)

## 2022-03-16 ENCOUNTER — Ambulatory Visit: Payer: BC Managed Care – PPO | Admitting: Urology

## 2022-03-17 ENCOUNTER — Encounter (INDEPENDENT_AMBULATORY_CARE_PROVIDER_SITE_OTHER): Payer: Self-pay | Admitting: Gastroenterology

## 2022-03-17 ENCOUNTER — Ambulatory Visit (INDEPENDENT_AMBULATORY_CARE_PROVIDER_SITE_OTHER): Payer: BC Managed Care – PPO | Admitting: Gastroenterology

## 2022-03-17 VITALS — BP 132/94 | HR 99 | Temp 97.8°F | Ht 71.0 in | Wt 214.7 lb

## 2022-03-17 DIAGNOSIS — K5900 Constipation, unspecified: Secondary | ICD-10-CM | POA: Diagnosis not present

## 2022-03-17 DIAGNOSIS — R1310 Dysphagia, unspecified: Secondary | ICD-10-CM | POA: Diagnosis not present

## 2022-03-17 DIAGNOSIS — K219 Gastro-esophageal reflux disease without esophagitis: Secondary | ICD-10-CM

## 2022-03-17 DIAGNOSIS — Z1211 Encounter for screening for malignant neoplasm of colon: Secondary | ICD-10-CM | POA: Diagnosis not present

## 2022-03-17 MED ORDER — PANTOPRAZOLE SODIUM 40 MG PO TBEC: 40.0000 mg | DELAYED_RELEASE_TABLET | Freq: Every day | ORAL | 1 refills | Status: DC

## 2022-03-17 NOTE — Progress Notes (Signed)
Referring Provider: Curlene Labrum, MD Primary Care Physician:  Curlene Labrum, MD Primary GI Physician: new  Chief Complaint  Patient presents with   Gastroesophageal Reflux    Has acid reflux, throat discomfort and choking. Takes omeprazole. Would also like to get  Egd and TCS scheduled.    HPI:   Juan Murphy is a 46 y.o. male with past medical history of GERD, renal calculi, plantar fasciitis.  Patient presenting today as a new patient to schedule screening TCS and for dysphagia.   Recent labs with normal CBC, CMP, and TSH 2.24  Reports history of dysphagia for the past few years, has gotten worse recently. Usually with certain foods, thicker, dryer stuff. He is on low dose omeprazole daily, has been on this for a while.he is having a lot of acid reflux symptoms with a bad taste in his mouth and sore throat. Sometimes has to cough foods back up. Liquids usually do not cause issues.sometimes larger pills get stuck. GERD symptoms almost daily.   He reports some constipation with some mid to lower abdominal pain, pain sometimes improves with a bowel movement. He takes metamucil and eats oatmeal which seems to help. Maybe 3 BMs per week. Feels that he drinks a decent amount of water.   Patient denies melena, hematochezia, nausea, vomiting, diarrhea, odyonophagia, early satiety or weight loss.   NSAID use: is on meloxicam, maybe twice per week, no other NSAIDs  Social hx: no etoh or tobacco  Fam [id number] history of colon polyps, does not think anyone has had CRC  Last Colonoscopy: never Last Endoscopy:never  Past Medical History:  Diagnosis Date   GERD (gastroesophageal reflux disease)    Kidney stone    Plantar fasciitis     Past Surgical History:  Procedure Laterality Date   SHOULDER SURGERY Right    dec 2022    Current Outpatient Medications  Medication Sig Dispense Refill   meloxicam (MOBIC) 15 MG tablet Take 15 mg by mouth daily. As needed      omeprazole (PRILOSEC) 20 MG capsule Take 20 mg by mouth daily.     tamsulosin (FLOMAX) 0.4 MG CAPS capsule Take 1 capsule (0.4 mg total) by mouth daily after supper. 30 capsule 11   No current facility-administered medications for this visit.    Allergies as of 03/17/2022   (No Known Allergies)    Family History  Problem Relation Age of Onset   Colonic polyp Father    Hypertension Father    Colon polyps Sister     Social History   Socioeconomic History   Marital status: Divorced    Spouse name: Not on file   Number of children: Not on file   Years of education: Not on file   Highest education level: Not on file  Occupational History   Not on file  Tobacco Use   Smoking status: Never    Passive exposure: Past   Smokeless tobacco: Never  Substance and Sexual Activity   Alcohol use: Never   Drug use: Never   Sexual activity: Not on file  Other Topics Concern   Not on file  Social History Narrative   Not on file   Social Determinants of Health   Financial Resource Strain: Not on file  Food Insecurity: Not on file  Transportation Needs: Not on file  Physical Activity: Not on file  Stress: Not on file  Social Connections: Not on file   Review of systems General: negative for malaise,  night sweats, fever, chills, weight loss Neck: Negative for lumps, goiter, pain and significant neck swelling Resp: Negative for cough, wheezing, dyspnea at rest CV: Negative for chest pain, leg swelling, palpitations, orthopnea GI: denies melena, hematochezia, nausea, vomiting, diarrhea, odyonophagia, early satiety or unintentional weight loss. +GERD symptoms +dysphagia +constipation MSK: Negative for joint pain or swelling, back pain, and muscle pain. Derm: Negative for itching or rash Psych: Denies depression, anxiety, memory loss, confusion. No homicidal or suicidal ideation.  Heme: Negative for prolonged bleeding, bruising easily, and swollen nodes. Endocrine: Negative for cold  or heat intolerance, polyuria, polydipsia and goiter. Neuro: negative for tremor, gait imbalance, syncope and seizures. The remainder of the review of systems is noncontributory.  Physical Exam: BP (!) 132/94 (BP Location: Right Arm, Patient Position: Sitting, Cuff Size: Large)   Pulse 99   Temp 97.8 F (36.6 C) (Oral)   Ht '5\' 11"'$  (1.803 m)   Wt 214 lb 11.2 oz (97.4 kg)   BMI 29.94 kg/m  General:   Alert and oriented. No distress noted. Pleasant and cooperative.  Head:  Normocephalic and atraumatic. Eyes:  Conjuctiva clear without scleral icterus. Mouth:  Oral mucosa pink and moist. Good dentition. No lesions. Heart: Normal rate and rhythm, s1 and s2 heart sounds present.  Lungs: Clear lung sounds in all lobes. Respirations equal and unlabored. Abdomen:  +BS, soft, non-tender and non-distended. No rebound or guarding. No HSM or masses noted. Derm: No palmar erythema or jaundice Msk:  Symmetrical without gross deformities. Normal posture. Extremities:  Without edema. Neurologic:  Alert and  oriented x4 Psych:  Alert and cooperative. Normal mood and affect.  Invalid input(s): "6 MONTHS"   ASSESSMENT: Juan Murphy is a 46 y.o. male presenting today as a new patient to schedule screening colonoscopy and for dysphagia, GERD and constipation.  GERD/Dysphagia: present for a few years, worse with certain foods and larger pills. On omeprazole '20mg'$  daily, sometimes doubles up. Has GERD symptoms with a lot of acid regurgitation, sore throat and foul taste in his mouth almost daily. Will stop omeprazole and start protonix '40mg'$  daily. Advised of reflux precautions to include  Avoiding greasy, spicy, fried, citrus foods, and be mindful that caffeine, carbonated drinks, chocolate and alcohol can increase reflux symptoms. Stay upright 2-3 hours after eating, prior to lying down and avoid eating late in the evenings. Recommend proceeding with EGD for further evaluation as we cannot rule out  esophageal ring, web, stricture, stenosis, less likely malignancy.   Patient is due for initial screening colonoscopy. Has some constipation at baseline, no rectal bleeding or melena. Instructed to start benefiber 1T 2-3x/day with meals and ensure water intake is atleast 64 oz per day. No family hx of CRC that he is aware of but sister and father have had polyps.  Indications, risks and benefits of procedure discussed in detail with patient. Patient verbalized understanding and is in agreement to proceed with EGD and colonoscopy at this time.    PLAN:  EGD and Colonoscopy ENDO1, ASA 1, 2 day prep 2.  Rx protonix '40mg'$   3. Benefiber 1T 2-3 x/day with meals 4. Chewing precautions 5. Reflux precautions  All questions were answered, patient verbalized understanding and is in agreement with plan as outlined above.    Follow Up: 3 months  Charnelle Bergeman L. Alver Sorrow, MSN, APRN, AGNP-C Adult-Gerontology Nurse Practitioner [facility]

## 2022-03-17 NOTE — H&P (View-Only) (Signed)
Referring Provider: Curlene Labrum, MD Primary Care Physician:  Curlene Labrum, MD Primary GI Physician: new  Chief Complaint  Patient presents with   Gastroesophageal Reflux    Has acid reflux, throat discomfort and choking. Takes omeprazole. Would also like to get  Egd and TCS scheduled.    HPI:   Juan Murphy is a 46 y.o. male with past medical history of GERD, renal calculi, plantar fasciitis.  Patient presenting today as a new patient to schedule screening TCS and for dysphagia.   Recent labs with normal CBC, CMP, and TSH 2.24  Reports history of dysphagia for the past few years, has gotten worse recently. Usually with certain foods, thicker, dryer stuff. He is on low dose omeprazole daily, has been on this for a while.he is having a lot of acid reflux symptoms with a bad taste in his mouth and sore throat. Sometimes has to cough foods back up. Liquids usually do not cause issues.sometimes larger pills get stuck. GERD symptoms almost daily.   He reports some constipation with some mid to lower abdominal pain, pain sometimes improves with a bowel movement. He takes metamucil and eats oatmeal which seems to help. Maybe 3 BMs per week. Feels that he drinks a decent amount of water.   Patient denies melena, hematochezia, nausea, vomiting, diarrhea, odyonophagia, early satiety or weight loss.   NSAID use: is on meloxicam, maybe twice per week, no other NSAIDs  Social hx: no etoh or tobacco  Fam UR:KYHCWC history of colon polyps, does not think anyone has had CRC  Last Colonoscopy: never Last Endoscopy:never  Past Medical History:  Diagnosis Date   GERD (gastroesophageal reflux disease)    Kidney stone    Plantar fasciitis     Past Surgical History:  Procedure Laterality Date   SHOULDER SURGERY Right    dec 2022    Current Outpatient Medications  Medication Sig Dispense Refill   meloxicam (MOBIC) 15 MG tablet Take 15 mg by mouth daily. As needed      omeprazole (PRILOSEC) 20 MG capsule Take 20 mg by mouth daily.     tamsulosin (FLOMAX) 0.4 MG CAPS capsule Take 1 capsule (0.4 mg total) by mouth daily after supper. 30 capsule 11   No current facility-administered medications for this visit.    Allergies as of 03/17/2022   (No Known Allergies)    Family History  Problem Relation Age of Onset   Colonic polyp Father    Hypertension Father    Colon polyps Sister     Social History   Socioeconomic History   Marital status: Divorced    Spouse name: Not on file   Number of children: Not on file   Years of education: Not on file   Highest education level: Not on file  Occupational History   Not on file  Tobacco Use   Smoking status: Never    Passive exposure: Past   Smokeless tobacco: Never  Substance and Sexual Activity   Alcohol use: Never   Drug use: Never   Sexual activity: Not on file  Other Topics Concern   Not on file  Social History Narrative   Not on file   Social Determinants of Health   Financial Resource Strain: Not on file  Food Insecurity: Not on file  Transportation Needs: Not on file  Physical Activity: Not on file  Stress: Not on file  Social Connections: Not on file   Review of systems General: negative for malaise,  night sweats, fever, chills, weight loss Neck: Negative for lumps, goiter, pain and significant neck swelling Resp: Negative for cough, wheezing, dyspnea at rest CV: Negative for chest pain, leg swelling, palpitations, orthopnea GI: denies melena, hematochezia, nausea, vomiting, diarrhea, odyonophagia, early satiety or unintentional weight loss. +GERD symptoms +dysphagia +constipation MSK: Negative for joint pain or swelling, back pain, and muscle pain. Derm: Negative for itching or rash Psych: Denies depression, anxiety, memory loss, confusion. No homicidal or suicidal ideation.  Heme: Negative for prolonged bleeding, bruising easily, and swollen nodes. Endocrine: Negative for cold  or heat intolerance, polyuria, polydipsia and goiter. Neuro: negative for tremor, gait imbalance, syncope and seizures. The remainder of the review of systems is noncontributory.  Physical Exam: BP (!) 132/94 (BP Location: Right Arm, Patient Position: Sitting, Cuff Size: Large)   Pulse 99   Temp 97.8 F (36.6 C) (Oral)   Ht '5\' 11"'$  (1.803 m)   Wt 214 lb 11.2 oz (97.4 kg)   BMI 29.94 kg/m  General:   Alert and oriented. No distress noted. Pleasant and cooperative.  Head:  Normocephalic and atraumatic. Eyes:  Conjuctiva clear without scleral icterus. Mouth:  Oral mucosa pink and moist. Good dentition. No lesions. Heart: Normal rate and rhythm, s1 and s2 heart sounds present.  Lungs: Clear lung sounds in all lobes. Respirations equal and unlabored. Abdomen:  +BS, soft, non-tender and non-distended. No rebound or guarding. No HSM or masses noted. Derm: No palmar erythema or jaundice Msk:  Symmetrical without gross deformities. Normal posture. Extremities:  Without edema. Neurologic:  Alert and  oriented x4 Psych:  Alert and cooperative. Normal mood and affect.  Invalid input(s): "6 MONTHS"   ASSESSMENT: Juan Murphy is a 46 y.o. male presenting today as a new patient to schedule screening colonoscopy and for dysphagia, GERD and constipation.  GERD/Dysphagia: present for a few years, worse with certain foods and larger pills. On omeprazole '20mg'$  daily, sometimes doubles up. Has GERD symptoms with a lot of acid regurgitation, sore throat and foul taste in his mouth almost daily. Will stop omeprazole and start protonix '40mg'$  daily. Advised of reflux precautions to include  Avoiding greasy, spicy, fried, citrus foods, and be mindful that caffeine, carbonated drinks, chocolate and alcohol can increase reflux symptoms. Stay upright 2-3 hours after eating, prior to lying down and avoid eating late in the evenings. Recommend proceeding with EGD for further evaluation as we cannot rule out  esophageal ring, web, stricture, stenosis, less likely malignancy.   Patient is due for initial screening colonoscopy. Has some constipation at baseline, no rectal bleeding or melena. Instructed to start benefiber 1T 2-3x/day with meals and ensure water intake is atleast 64 oz per day. No family hx of CRC that he is aware of but sister and father have had polyps.  Indications, risks and benefits of procedure discussed in detail with patient. Patient verbalized understanding and is in agreement to proceed with EGD and colonoscopy at this time.    PLAN:  EGD and Colonoscopy ENDO1, ASA 1, 2 day prep 2.  Rx protonix '40mg'$   3. Benefiber 1T 2-3 x/day with meals 4. Chewing precautions 5. Reflux precautions  All questions were answered, patient verbalized understanding and is in agreement with plan as outlined above.    Follow Up: 3 months  Auden Tatar L. Alver Sorrow, MSN, APRN, AGNP-C Adult-Gerontology Nurse Practitioner [facility]

## 2022-03-17 NOTE — Patient Instructions (Addendum)
It was nice to meet you! We will get you scheduled for an upper endoscopy for your swallowing issues as well as a colonoscopy as colon cancer screenings should start at age 46.  I have sent Protonix '40mg'$  to your pharmacy (please stop omeprazole) Please take this 30 minutes prior to breakfast Avoid greasy, spicy, fried, citrus foods, and be mindful that caffeine, carbonated drinks, chocolate and alcohol can increase reflux symptoms Stay upright 2-3 hours after eating, prior to lying down and avoid eating late in the evenings.  Please make sure you are avoiding thicker, dryer foods, chewing well and taking sips of liquids between bites to avoid choking  You can try adding in over the counter benefiber, 1T 2-3 times per day with meals. Makes sure you are drinking atleast 64 oz of water per day. If constipation does not improve, please let me know.   Follow up 3 months

## 2022-03-18 ENCOUNTER — Telehealth (INDEPENDENT_AMBULATORY_CARE_PROVIDER_SITE_OTHER): Payer: Self-pay

## 2022-03-18 ENCOUNTER — Encounter (INDEPENDENT_AMBULATORY_CARE_PROVIDER_SITE_OTHER): Payer: Self-pay

## 2022-03-18 ENCOUNTER — Other Ambulatory Visit (INDEPENDENT_AMBULATORY_CARE_PROVIDER_SITE_OTHER): Payer: Self-pay

## 2022-03-18 DIAGNOSIS — R1310 Dysphagia, unspecified: Secondary | ICD-10-CM

## 2022-03-18 DIAGNOSIS — Z1211 Encounter for screening for malignant neoplasm of colon: Secondary | ICD-10-CM

## 2022-03-18 MED ORDER — NA SULFATE-K SULFATE-MG SULF 17.5-3.13-1.6 GM/177ML PO SOLN: 1.0000 | Freq: Once | ORAL | 0 refills | Status: AC

## 2022-03-18 NOTE — Telephone Encounter (Signed)
Aashi Derrington Ann Burnett Spray, CMA  ?

## 2022-03-24 ENCOUNTER — Other Ambulatory Visit: Payer: Self-pay | Admitting: Urology

## 2022-03-24 DIAGNOSIS — R7989 Other specified abnormal findings of blood chemistry: Secondary | ICD-10-CM

## 2022-04-06 ENCOUNTER — Ambulatory Visit (INDEPENDENT_AMBULATORY_CARE_PROVIDER_SITE_OTHER): Payer: BC Managed Care – PPO | Admitting: Urology

## 2022-04-06 ENCOUNTER — Telehealth: Payer: Self-pay

## 2022-04-06 ENCOUNTER — Encounter: Payer: Self-pay | Admitting: Urology

## 2022-04-06 VITALS — BP 136/93 | HR 92

## 2022-04-06 DIAGNOSIS — R7989 Other specified abnormal findings of blood chemistry: Secondary | ICD-10-CM | POA: Diagnosis not present

## 2022-04-06 DIAGNOSIS — N200 Calculus of kidney: Secondary | ICD-10-CM

## 2022-04-06 DIAGNOSIS — R3912 Poor urinary stream: Secondary | ICD-10-CM

## 2022-04-06 DIAGNOSIS — E291 Testicular hypofunction: Secondary | ICD-10-CM | POA: Diagnosis not present

## 2022-04-06 LAB — URINALYSIS, ROUTINE W REFLEX MICROSCOPIC
Bilirubin, UA: NEGATIVE
Glucose, UA: NEGATIVE
Ketones, UA: NEGATIVE
Leukocytes,UA: NEGATIVE
Nitrite, UA: NEGATIVE
Protein,UA: NEGATIVE
RBC, UA: NEGATIVE
Specific Gravity, UA: 1.01 (ref 1.005–1.030)
Urobilinogen, Ur: 0.2 mg/dL (ref 0.2–1.0)
pH, UA: 7 (ref 5.0–7.5)

## 2022-04-06 MED ORDER — CYCLOBENZAPRINE HCL 5 MG PO TABS: 5.0000 mg | ORAL_TABLET | Freq: Three times a day (TID) | ORAL | 0 refills | Status: DC | PRN

## 2022-04-06 MED ORDER — CLOMIPHENE CITRATE 50 MG PO TABS
25.0000 mg | ORAL_TABLET | Freq: Every day | ORAL | 3 refills | Status: DC
Start: 2022-04-06 — End: 2022-07-08

## 2022-04-06 NOTE — Telephone Encounter (Signed)
Patient wanted to know if the referral could be for another Endocrinologist. I contacted  Endo and they advised they are backed up with referrals about 3 months.

## 2022-04-06 NOTE — Progress Notes (Signed)
04/06/2022 1:58 PM   Juan Murphy 1975/09/27 [id number]  Referring provider: Curlene Labrum, MD [place],  [place] [address]  No chief complaint on file.   HPI: Mr Juan Murphy is a 46yo here for followup for nephrolithiasis, weak urinary stream and hypogonadism. His 24 hour urine showed elevated Calcium and he was started on indapamide. Testosterone labs yielded elevated Prolactin level and a testosterone level 460. He had injected '200mg'$  a testosterone 10 days prior to getting the labs. He continue to have left flank pain that is worse with standing from a sitting position.    PMH: Past Medical History:  Diagnosis Date   GERD (gastroesophageal reflux disease)    Kidney stone    Plantar fasciitis     Surgical History: Past Surgical History:  Procedure Laterality Date   SHOULDER SURGERY Right    dec 2022    Home Medications:  Allergies as of 04/06/2022   No Known Allergies      Medication List        Accurate as of April 06, 2022  1:58 PM. If you have any questions, ask your nurse or doctor.          indapamide 1.25 MG tablet Commonly known as: LOZOL Take 1.25 mg by mouth daily.   meloxicam 15 MG tablet Commonly known as: MOBIC Take 15 mg by mouth daily as needed for pain.   pantoprazole 40 MG tablet Commonly known as: PROTONIX Take 1 tablet (40 mg total) by mouth daily before breakfast.   tamsulosin 0.4 MG Caps capsule Commonly known as: FLOMAX Take 1 capsule (0.4 mg total) by mouth daily after supper. What changed: when to take this        Allergies: No Known Allergies  Family History: Family History  Problem Relation Age of Onset   Colonic polyp Father    Hypertension Father    Colon polyps Sister     Social History:  reports that he has never smoked. He has been exposed to tobacco smoke. He has never used smokeless tobacco. He reports that he does not drink alcohol and does not use drugs.  ROS: All other review of  systems were reviewed and are negative except what is noted above in HPI  Physical Exam: BP (!) 136/93   Pulse 92   Constitutional:  Alert and oriented, No acute distress. HEENT: [place] AT, moist mucus membranes.  Trachea midline, no masses. Cardiovascular: No clubbing, cyanosis, or edema. Respiratory: Normal respiratory effort, no increased work of breathing. GI: Abdomen is soft, nontender, nondistended, no abdominal masses GU: No CVA tenderness.  Lymph: No cervical or inguinal lymphadenopathy. Skin: No rashes, bruises or suspicious lesions. Neurologic: Grossly intact, no focal deficits, moving all 4 extremities. Psychiatric: Normal mood and affect.  Laboratory Data: Lab Results  Component Value Date   WBC 7.0 03/08/2022   HGB 15.7 03/08/2022   HCT 46.8 03/08/2022   MCV 87 03/08/2022    Lab Results  Component Value Date   CREATININE 0.93 03/08/2022    No results found for: "PSA"  Lab Results  Component Value Date   TESTOSTERONE 459 03/08/2022    No results found for: "HGBA1C"  Urinalysis    Component Value Date/Time   APPEARANCEUR Clear 03/08/2022 1109   GLUCOSEU Negative 03/08/2022 1109   BILIRUBINUR Negative 03/08/2022 1109   PROTEINUR Negative 03/08/2022 1109   NITRITE Negative 03/08/2022 1109   LEUKOCYTESUR Negative 03/08/2022 1109    Lab Results  Component Value  Date   LABMICR Comment 03/08/2022   WBCUA 0-5 11/05/2020   LABEPIT None seen 11/05/2020   BACTERIA None seen 11/05/2020    Pertinent Imaging: KUB 03/08/2022: Images reviewed and discussed with the patient  Results for orders placed in visit on 03/08/22  Abdomen 1 view (KUB)  Narrative CLINICAL DATA:  History of kidney stones  EXAM: ABDOMEN - 1 VIEW  COMPARISON:  CT abdomen and pelvis 08/20/2020  FINDINGS: The bowel gas pattern is normal. No radio-opaque calculi or other significant radiographic abnormality are seen. The 7 mm left renal calculus on CT 08/20/2020 is not confidently  visualized.  IMPRESSION: Negative.   Electronically Signed By: Placido Sou M.D. On: 03/08/2022 18:49  No results found for this or any previous visit.  No results found for this or any previous visit.  No results found for this or any previous visit.  No results found for this or any previous visit.  No results found for this or any previous visit.  No results found for this or any previous visit.  No results found for this or any previous visit.   Assessment & Plan:    1. Kidney stones -CT stone study, will call with results - Urinalysis, Routine w reflex microscopic  2. Elevated prolactin level -referral to endocrinology  3. Hypogonadism male -Clomid '25mg'$  daily -RTC 3 months with testosterone labs  4. Weak urinary stream -continue flomax   No follow-ups on file.  Nicolette Bang, MD  [facility] [place]

## 2022-04-06 NOTE — Patient Instructions (Signed)

## 2022-04-07 ENCOUNTER — Encounter (HOSPITAL_COMMUNITY)
Admission: RE | Admit: 2022-04-07 | Discharge: 2022-04-07 | Disposition: A | Discharge status: Home / Self Care | Payer: BC Managed Care – PPO | Source: Ambulatory Visit | Attending: Gastroenterology | Admitting: Gastroenterology

## 2022-04-07 ENCOUNTER — Encounter (HOSPITAL_COMMUNITY): Payer: Self-pay

## 2022-04-07 ENCOUNTER — Other Ambulatory Visit: Payer: Self-pay

## 2022-04-12 ENCOUNTER — Ambulatory Visit (HOSPITAL_COMMUNITY): Payer: BC Managed Care – PPO | Admitting: Anesthesiology

## 2022-04-12 ENCOUNTER — Encounter (HOSPITAL_COMMUNITY)
Admission: RE | Disposition: A | Discharge status: Home / Self Care | Payer: Self-pay | Source: Home / Self Care | Attending: Gastroenterology

## 2022-04-12 ENCOUNTER — Ambulatory Visit (HOSPITAL_COMMUNITY)
Admission: RE | Admit: 2022-04-12 | Discharge: 2022-04-12 | Disposition: A | Discharge status: Home / Self Care | Payer: BC Managed Care – PPO | Attending: Gastroenterology | Admitting: Gastroenterology

## 2022-04-12 ENCOUNTER — Encounter (HOSPITAL_COMMUNITY): Payer: Self-pay | Admitting: Gastroenterology

## 2022-04-12 DIAGNOSIS — K317 Polyp of stomach and duodenum: Secondary | ICD-10-CM | POA: Insufficient documentation

## 2022-04-12 DIAGNOSIS — R1310 Dysphagia, unspecified: Secondary | ICD-10-CM

## 2022-04-12 DIAGNOSIS — K648 Other hemorrhoids: Secondary | ICD-10-CM | POA: Insufficient documentation

## 2022-04-12 DIAGNOSIS — Z87442 Personal history of urinary calculi: Secondary | ICD-10-CM | POA: Diagnosis not present

## 2022-04-12 DIAGNOSIS — Z8371 Family history of colonic polyps: Secondary | ICD-10-CM | POA: Insufficient documentation

## 2022-04-12 DIAGNOSIS — Z1211 Encounter for screening for malignant neoplasm of colon: Secondary | ICD-10-CM | POA: Diagnosis present

## 2022-04-12 DIAGNOSIS — D123 Benign neoplasm of transverse colon: Secondary | ICD-10-CM | POA: Insufficient documentation

## 2022-04-12 DIAGNOSIS — D122 Benign neoplasm of ascending colon: Secondary | ICD-10-CM | POA: Insufficient documentation

## 2022-04-12 DIAGNOSIS — K2100 Gastro-esophageal reflux disease with esophagitis, without bleeding: Secondary | ICD-10-CM | POA: Insufficient documentation

## 2022-04-12 DIAGNOSIS — K635 Polyp of colon: Secondary | ICD-10-CM

## 2022-04-12 HISTORY — PX: POLYPECTOMY: SHX5525

## 2022-04-12 HISTORY — PX: ESOPHAGOGASTRODUODENOSCOPY (EGD) WITH PROPOFOL: SHX5813

## 2022-04-12 HISTORY — PX: BIOPSY: SHX5522

## 2022-04-12 HISTORY — PX: COLONOSCOPY WITH PROPOFOL: SHX5780

## 2022-04-12 SURGERY — COLONOSCOPY WITH PROPOFOL
Anesthesia: General

## 2022-04-12 MED ORDER — PROPOFOL 10 MG/ML IV BOLUS
INTRAVENOUS | Status: DC | PRN
  Administered 2022-04-12: 100 mg via INTRAVENOUS

## 2022-04-12 MED ORDER — LACTATED RINGERS IV SOLN
INTRAVENOUS | Status: DC
Start: 2022-04-12 — End: 2022-04-12

## 2022-04-12 MED ORDER — PANTOPRAZOLE SODIUM 40 MG PO TBEC: 40.0000 mg | DELAYED_RELEASE_TABLET | Freq: Two times a day (BID) | ORAL | 1 refills | Status: DC

## 2022-04-12 MED ORDER — PROPOFOL 500 MG/50ML IV EMUL
INTRAVENOUS | Status: DC | PRN
  Administered 2022-04-12: 150 ug/kg/min via INTRAVENOUS

## 2022-04-12 NOTE — Telephone Encounter (Signed)
[facility] Referral needs to be changed to [facility]. Patient states [facility] office is not calling to make an appointment.  [facility]. Phone - [phone] Fax [phone]   Please call Patient's girlfriend - Erline Levine [phone]   Thanks, Helene Kelp

## 2022-04-12 NOTE — Discharge Instructions (Addendum)
You are being discharged to home.  Resume your previous diet.  We are waiting for your pathology results.  Take Protonix (pantoprazole) 40 mg by mouth twice a day.  Your physician has recommended a repeat colonoscopy for surveillance based on pathology results.

## 2022-04-12 NOTE — Transfer of Care (Signed)
Immediate Anesthesia Transfer of Care Note  Patient: Juan Murphy  Procedure(s) Performed: COLONOSCOPY WITH PROPOFOL ESOPHAGOGASTRODUODENOSCOPY (EGD) WITH PROPOFOL BIOPSY POLYPECTOMY  Patient Location: PACU  Anesthesia Type:General  Level of Consciousness: awake, drowsy and patient cooperative  Airway & Oxygen Therapy: Patient Spontanous Breathing  Post-op Assessment: Report given to RN, Post -op Vital signs reviewed and stable and Patient moving all extremities X 4  Post vital signs: Reviewed and stable  Last Vitals:  Vitals Value Taken Time  BP    Temp 36.8 C 04/12/22 1448  Pulse 79 04/12/22 1448  Resp 16 04/12/22 1448  SpO2 95 % 04/12/22 1448    Last Pain:  Vitals:   04/12/22 1448  TempSrc: Axillary  PainSc:          Complications: No notable events documented.

## 2022-04-12 NOTE — Interval H&P Note (Signed)
History and Physical Interval Note:  04/12/2022 2:01 PM  Juan Murphy  has presented today for surgery, with the diagnosis of Screening Colonoscopy Dysphagia.  The various methods of treatment have been discussed with the patient and family. After consideration of risks, benefits and other options for treatment, the patient has consented to  Procedure(s) with comments: COLONOSCOPY WITH PROPOFOL (N/A) - 150 ASA 2 ESOPHAGOGASTRODUODENOSCOPY (EGD) WITH PROPOFOL (N/A) as a surgical intervention.  The patient's history has been reviewed, patient examined, no change in status, stable for surgery.  I have reviewed the patient's chart and labs.  Questions were answered to the patient's satisfaction.     [name]

## 2022-04-12 NOTE — Op Note (Signed)
[facility] Patient Name: Juan Murphy Procedure Date: 04/12/2022 1:48 PM MRN: [id number] Date of Birth: 1976/02/19 Attending MD: Maylon Peppers ,  CSN: [id number] Age: 46 Admit Type: Outpatient Procedure:                Colonoscopy Indications:              Screening for colorectal malignant neoplasm Providers:                Maylon Peppers, Lambert Mody, Everardo Pacific Referring MD:              Medicines:                Monitored Anesthesia Care Complications:            No immediate complications. Estimated Blood Loss:     Estimated blood loss: none. Procedure:                Pre-Anesthesia Assessment:                           - Prior to the procedure, a History and Physical                            was performed, and patient medications, allergies                            and sensitivities were reviewed. The patient's                            tolerance of previous anesthesia was reviewed.                           - The risks and benefits of the procedure and the                            sedation options and risks were discussed with the                            patient. All questions were answered and informed                            consent was obtained.                           - ASA Grade Assessment: II - A patient with mild                            systemic disease.                           After obtaining informed consent, the colonoscope                            was passed under direct vision. Throughout the  procedure, the patient's blood pressure, pulse, and                            oxygen saturations were monitored continuously. The                            [id number]-HQ190L ([id number]) scope was introduced through                            the anus and advanced to the the cecum, identified                            by appendiceal orifice and ileocecal valve. The                             colonoscopy was performed without difficulty. The                            patient tolerated the procedure well. The quality                            of the bowel preparation was excellent. Scope In: 2:21:32 PM Scope Out: 2:45:50 PM Scope Withdrawal Time: 0 hours 18 minutes 20 seconds  Total Procedure Duration: 0 hours 24 minutes 18 seconds  Findings:      The perianal and digital rectal examinations were normal.      Three sessile polyps were found in the transverse colon and ascending       colon. The polyps were 4 to 8 mm in size. These polyps were removed with       a cold snare. Resection and retrieval were complete.      Non-bleeding internal hemorrhoids were found during retroflexion. The       hemorrhoids were small. Impression:               - Three 4 to 8 mm polyps in the transverse colon                            and in the ascending colon, removed with a cold                            snare. Resected and retrieved.                           - Non-bleeding internal hemorrhoids. Moderate Sedation:      Per Anesthesia Care Recommendation:           - Discharge patient to home (ambulatory).                           - Resume previous diet.                           - Await pathology results.                           - Repeat colonoscopy for surveillance  based on                            pathology results. Procedure Code(s):        --- Professional ---                           [id number], Colonoscopy, flexible; with removal of                            tumor(s), polyp(s), or other lesion(s) by snare                            technique Diagnosis Code(s):        --- Professional ---                           Z12.11, Encounter for screening for malignant                            neoplasm of colon                           K63.5, Polyp of colon                           K64.8, Other hemorrhoids CPT copyright 2019 [employer]. All rights  reserved. The codes documented in this report are preliminary and upon coder review may  be revised to meet current compliance requirements. Maylon Peppers, MD Maylon Peppers,  04/12/2022 2:50:11 PM This report has been signed electronically. Number of Addenda: 0

## 2022-04-12 NOTE — Op Note (Signed)
[facility] Patient Name: Juan Murphy Procedure Date: 04/12/2022 1:47 PM MRN: [id number] Date of Birth: [birth year] Attending MD: Maylon Peppers ,  CSN: [id number] Age: 46 Admit Type: Outpatient Procedure:                Upper GI endoscopy Indications:              Dysphagia, Follow-up of gastro-esophageal reflux                            disease Providers:                Maylon Peppers, Lambert Mody, Everardo Pacific Referring MD:              Medicines:                Monitored Anesthesia Care Complications:            No immediate complications. Estimated Blood Loss:     Estimated blood loss: none. Procedure:                Pre-Anesthesia Assessment:                           - Prior to the procedure, a History and Physical                            was performed, and patient medications, allergies                            and sensitivities were reviewed. The patient's                            tolerance of previous anesthesia was reviewed.                           - The risks and benefits of the procedure and the                            sedation options and risks were discussed with the                            patient. All questions were answered and informed                            consent was obtained.                           - ASA Grade Assessment: II - A patient with mild                            systemic disease.                           After obtaining informed consent, the endoscope was  passed under direct vision. Throughout the                            procedure, the patient's blood pressure, pulse, and                            oxygen saturations were monitored continuously. The                            [id number]-[id number] ([id number]) scope was introduced through the                            mouth, and advanced to the second part of duodenum.                            The upper GI  endoscopy was accomplished without                            difficulty. The patient tolerated the procedure                            well. Scope In: 2:05:39 PM Scope Out: 2:16:10 PM Total Procedure Duration: 0 hours 10 minutes 31 seconds  Findings:      No endoscopic abnormality was evident in the esophagus to explain the       patient's complaint of dysphagia. It was decided, however, to proceed       with dilation of the entire esophagus. A guidewire was placed and the       scope was withdrawn. Dilation was performed with a Savary dilator with       no resistance at 18 mm. Biopsies were obtained from the proximal and       distal esophagus with cold forceps for histology of suspected       eosinophilic esophagitis.      LA Grade A (one or more mucosal breaks less than 5 mm, not extending       between tops of 2 mucosal folds) esophagitis with no bleeding was found       at the gastroesophageal junction.      A few small sessile fundic gland polyps with no bleeding were found in       the gastric fundus.      The examined duodenum was normal. Biopsies were taken with a cold       forceps for histology. Impression:               - No endoscopic esophageal abnormality to explain                            patient's dysphagia. Esophagus dilated. Dilated.                            Biopsied.                           - LA Grade A reflux esophagitis with no bleeding.                           -  A few fundic gland polyps.                           - Normal examined duodenum. Biopsied. Moderate Sedation:      Per Anesthesia Care Recommendation:           - Discharge patient to home (ambulatory).                           - Resume previous diet.                           - Await pathology results.                           - Use Protonix (pantoprazole) 40 mg PO BID. Procedure Code(s):        --- Professional ---                           [phone], Esophagogastroduodenoscopy, flexible,                             transoral; with insertion of guide wire followed by                            passage of dilator(s) through esophagus over guide                            wire                           [id number], 85, Esophagogastroduodenoscopy, flexible,                            transoral; with biopsy, single or multiple Diagnosis Code(s):        --- Professional ---                           R13.10, Dysphagia, unspecified                           K21.00, Gastro-esophageal reflux disease with                            esophagitis, without bleeding                           K31.7, Polyp of stomach and duodenum CPT copyright 2019 American Medical Association. All rights reserved. The codes documented in this report are preliminary and upon coder review may  be revised to meet current compliance requirements. Maylon Peppers, MD Maylon Peppers,  04/12/2022 2:20:21 PM This report has been signed electronically. Number of Addenda: 0

## 2022-04-12 NOTE — Telephone Encounter (Signed)
Referral placed.

## 2022-04-12 NOTE — Anesthesia Postprocedure Evaluation (Signed)
Anesthesia Post Note  Patient: Juan Murphy  Procedure(s) Performed: COLONOSCOPY WITH PROPOFOL ESOPHAGOGASTRODUODENOSCOPY (EGD) WITH PROPOFOL BIOPSY POLYPECTOMY  Patient location during evaluation: Phase II Anesthesia Type: General Level of consciousness: awake and alert and oriented Pain management: pain level controlled Vital Signs Assessment: post-procedure vital signs reviewed and stable Respiratory status: spontaneous breathing, nonlabored ventilation and respiratory function stable Cardiovascular status: blood pressure returned to baseline and stable Postop Assessment: no apparent nausea or vomiting Anesthetic complications: no   No notable events documented.   Last Vitals:  Vitals:   04/12/22 1320 04/12/22 1448  BP: 114/77 101/69  Pulse: 82 79  Resp: 14 16  Temp: 36.7 C 36.8 C  SpO2: 100% 95%    Last Pain:  Vitals:   04/12/22 1448  TempSrc: Axillary  PainSc: 0-No pain                 Juan Murphy

## 2022-04-12 NOTE — Anesthesia Preprocedure Evaluation (Signed)
Anesthesia Evaluation  Patient identified by MRN, date of birth, ID band Patient awake    Reviewed: Allergy & Precautions, NPO status , Patient's Chart, lab work & pertinent test results  Airway Mallampati: II  TM Distance: >3 FB Neck ROM: Full    Dental  (+) Dental Advisory Given, Teeth Intact   Pulmonary neg pulmonary ROS,    Pulmonary exam normal breath sounds clear to auscultation       Cardiovascular negative cardio ROS Normal cardiovascular exam Rhythm:Regular Rate:Normal     Neuro/Psych negative neurological ROS  negative psych ROS   GI/Hepatic Neg liver ROS, GERD  Medicated,  Endo/Other  negative endocrine ROS  Renal/GU Renal disease  negative genitourinary   Musculoskeletal negative musculoskeletal ROS (+)   Abdominal   Peds negative pediatric ROS (+)  Hematology negative hematology ROS (+)   Anesthesia Other Findings   Reproductive/Obstetrics negative OB ROS                           Anesthesia Physical Anesthesia Plan  ASA: 2  Anesthesia Plan: General   Post-op Pain Management: Minimal or no pain anticipated   Induction: Intravenous  PONV Risk Score and Plan: Propofol infusion  Airway Management Planned: Nasal Cannula and Natural Airway  Additional Equipment:   Intra-op Plan:   Post-operative Plan:   Informed Consent: I have reviewed the patients History and Physical, chart, labs and discussed the procedure including the risks, benefits and alternatives for the proposed anesthesia with the patient or authorized representative who has indicated his/her understanding and acceptance.     Dental advisory given  Plan Discussed with: CRNA and Surgeon  Anesthesia Plan Comments:        Anesthesia Quick Evaluation

## 2022-04-13 LAB — GLIADIN ANTIBODIES, SERUM
Antigliadin Abs, IgA: 5 units (ref 0–19)
Gliadin IgG: 1 units (ref 0–19)

## 2022-04-13 LAB — TISSUE TRANSGLUTAMINASE, IGA: Tissue Transglutaminase Ab, IgA: <2 U/mL (ref 0–3)

## 2022-04-14 ENCOUNTER — Ambulatory Visit (HOSPITAL_COMMUNITY)
Admission: RE | Admit: 2022-04-14 | Discharge: 2022-04-14 | Disposition: A | Discharge status: Home / Self Care | Payer: BC Managed Care – PPO | Source: Ambulatory Visit | Attending: Urology | Admitting: Urology

## 2022-04-14 DIAGNOSIS — N200 Calculus of kidney: Secondary | ICD-10-CM | POA: Diagnosis present

## 2022-04-14 LAB — RETICULIN ANTIBODIES, IGA W TITER: Reticulin Ab, IgA: NEGATIVE titer (ref <2.5)

## 2022-04-14 LAB — SURGICAL PATHOLOGY

## 2022-04-19 ENCOUNTER — Encounter (HOSPITAL_COMMUNITY): Payer: Self-pay | Admitting: Gastroenterology

## 2022-04-21 ENCOUNTER — Telehealth: Payer: Self-pay

## 2022-04-21 NOTE — Telephone Encounter (Signed)
Patient called and wanted to know if he could receive a call regarding the results of his CT scan.    [phone]

## 2022-04-25 NOTE — Telephone Encounter (Signed)
Patient made aware of CT results per J. Summerlin PA.

## 2022-06-10 ENCOUNTER — Other Ambulatory Visit (INDEPENDENT_AMBULATORY_CARE_PROVIDER_SITE_OTHER): Payer: Self-pay | Admitting: Gastroenterology

## 2022-06-16 ENCOUNTER — Ambulatory Visit (INDEPENDENT_AMBULATORY_CARE_PROVIDER_SITE_OTHER): Payer: BC Managed Care – PPO | Admitting: Gastroenterology

## 2022-06-16 ENCOUNTER — Encounter (INDEPENDENT_AMBULATORY_CARE_PROVIDER_SITE_OTHER): Payer: Self-pay | Admitting: *Deleted

## 2022-06-16 ENCOUNTER — Encounter (INDEPENDENT_AMBULATORY_CARE_PROVIDER_SITE_OTHER): Payer: Self-pay | Admitting: Gastroenterology

## 2022-06-16 VITALS — BP 117/81 | HR 79 | Temp 97.8°F | Ht 71.0 in | Wt 214.9 lb

## 2022-06-16 DIAGNOSIS — R1011 Right upper quadrant pain: Secondary | ICD-10-CM | POA: Diagnosis not present

## 2022-06-16 DIAGNOSIS — R1310 Dysphagia, unspecified: Secondary | ICD-10-CM

## 2022-06-16 DIAGNOSIS — K219 Gastro-esophageal reflux disease without esophagitis: Secondary | ICD-10-CM

## 2022-06-16 DIAGNOSIS — K582 Mixed irritable bowel syndrome: Secondary | ICD-10-CM

## 2022-06-16 NOTE — Patient Instructions (Addendum)
It was nice to see you! Let's get an [place] of your gallbladder to make sure this is not causing your right sided pain I am providing the low FODMAP diet as this can be helpful with IBS, as well as keeping track of foods you have eaten and GI symptoms you are having, as discussed stress can play a big factor in this as well. Continue taking protonix daily benefiber 2-3x/day with good water intake  Follow up 6 months

## 2022-06-16 NOTE — Progress Notes (Addendum)
Referring Provider: Curlene Labrum, MD Primary Care Physician:  Curlene Labrum, MD Primary GI Physician: Jenetta Downer   Chief Complaint  Patient presents with   Gastroesophageal Reflux    3 month follow up on GERD. Taking pantoprazole and reports doing well with med.    HPI:   Juan Murphy is a 46 y.o. male with past medical history of GERD, renal calculi, plantar fasciitis.  Patient presenting today for follow up of dysphagia.  Last seen August 2023, at that time reporting history of dysphagia for the past few years, worse recently. Usually with certain foods, thicker, dryer stuff. on low dose omeprazole daily, has been on this for a while.he is having a lot of acid reflux symptoms with a bad taste in his mouth and sore throat. Sometimes has to cough foods back up. GERD symptoms almost daily.    reports some constipation with some mid to lower abdominal pain, pain sometimes improves with a bowel movement. He takes metamucil and eats oatmeal which seems to help. Maybe 3 BMs per week. Feels that he drinks a decent amount of water.   Recommended EGD, TCS, Rx protonix '40mg'$  daily, benefiber 1T BID-TID  Present: States that heartburn and acid regurgitation is rare now, having some dysphagia still, usually with certain foods like popcorn or rice, sometimes thicker, drier foods. Sometimes has to cough food back up, sometimes can get it to pass. Feels it has improved some since EGD with dilation. Denies odynophagia.   Previous celiac testing was negative. Constipation has improved since starting benefiber once daily. sometimes he has some fecal urgency after certain foods, also with some abdominal pain and looser stools during those times. Abdominal pain improves after having a BM. Has started to try and keep track of which foods tend to cause these issues. Notes more stress recently with campaigning as well.   Has occasional RUQ pain, sometimes radiates to his back, also with  associated nausea. This has been ongoing maybe 6 months, unable to pinpoint specific causes. Usually resolves on its own.   Last Colonoscopy:- 04/12/2022 Three 4 to 8 mm polyps in the transverse colon and in the ascending colon, sessile polyps  - Non-bleeding internal hemorrhoids. Last Endoscopy:- 04/12/2022 No endoscopic esophageal abnormality to explain patient's dysphagia. Esophagus dilated.Dilated. Biopsied-reflux changes - LA Grade A reflux esophagitis with no bleeding. - A few fundic gland polyps. - Normal examined duodenum. Biopsied-normal   Recommendations:  Repeat TCS 3 years  Past Medical History:  Diagnosis Date   GERD (gastroesophageal reflux disease)    Kidney stone    Plantar fasciitis     Past Surgical History:  Procedure Laterality Date   BIOPSY  04/12/2022   Procedure: BIOPSY;  Surgeon: Harvel Quale, MD;  Location: [facility];  Service: Gastroenterology;;   COLONOSCOPY WITH PROPOFOL N/A 04/12/2022   Procedure: COLONOSCOPY WITH PROPOFOL;  Surgeon: Harvel Quale, MD;  Location: [facility];  Service: Gastroenterology;  Laterality: N/A;  150 ASA 2   CYSTOSCOPY     ESOPHAGOGASTRODUODENOSCOPY (EGD) WITH PROPOFOL N/A 04/12/2022   Procedure: ESOPHAGOGASTRODUODENOSCOPY (EGD) WITH PROPOFOL;  Surgeon: Harvel Quale, MD;  Location: [facility];  Service: Gastroenterology;  Laterality: N/A;   POLYPECTOMY  04/12/2022   Procedure: POLYPECTOMY;  Surgeon: Harvel Quale, MD;  Location: [facility];  Service: Gastroenterology;;   SHOULDER SURGERY Right    dec 2022   TONSILLECTOMY      Current Outpatient Medications  Medication Sig Dispense Refill  clomiPHENE (CLOMID) 50 MG tablet Take 0.5 tablets (25 mg total) by mouth daily. 45 tablet 3   cyclobenzaprine (FLEXERIL) 5 MG tablet Take 1 tablet (5 mg total) by mouth 3 (three) times daily as needed for muscle spasms. 30 tablet 0   indapamide (LOZOL) 1.25 MG tablet Take 1.25 mg by mouth  daily.     meloxicam (MOBIC) 15 MG tablet Take 15 mg by mouth daily as needed for pain.     pantoprazole (PROTONIX) 40 MG tablet TAKE 1 TABLET BY MOUTH ONCE DAILY BEFORE BREAKFAST 30 tablet 0   tamsulosin (FLOMAX) 0.4 MG CAPS capsule Take 1 capsule (0.4 mg total) by mouth daily after supper. (Patient taking differently: Take 0.4 mg by mouth at bedtime.) 30 capsule 11   No current facility-administered medications for this visit.    Allergies as of 06/16/2022   (No Known Allergies)    Family History  Problem Relation Age of Onset   Colonic polyp Father    Hypertension Father    Colon polyps Sister     Social History   Socioeconomic History   Marital status: Divorced    Spouse name: Not on file   Number of children: Not on file   Years of education: Not on file   Highest education level: Not on file  Occupational History   Not on file  Tobacco Use   Smoking status: Never    Passive exposure: Past   Smokeless tobacco: Never  Vaping Use   Vaping Use: Never used  Substance and Sexual Activity   Alcohol use: Never   Drug use: Never   Sexual activity: Yes  Other Topics Concern   Not on file  Social History Narrative   Not on file   Social Determinants of Health   Financial Resource Strain: Not on file  Food Insecurity: Not on file  Transportation Needs: Not on file  Physical Activity: Not on file  Stress: Not on file  Social Connections: Not on file   Review of systems General: negative for malaise, night sweats, fever, chills, weight loss Neck: Negative for lumps, goiter, pain and significant neck swelling Resp: Negative for cough, wheezing, dyspnea at rest CV: Negative for chest pain, leg swelling, palpitations, orthopnea GI: denies melena, hematochezia, vomiting, constipation, odyonophagia, early satiety or unintentional weight loss. +RUQ pain, nausea, loose stools, abdominal pain, fecal urgency, Dysphagia  MSK: Negative for joint pain or swelling, back pain,  and muscle pain. Derm: Negative for itching or rash Psych: Denies depression, anxiety, memory loss, confusion. No homicidal or suicidal ideation.  Heme: Negative for prolonged bleeding, bruising easily, and swollen nodes. Endocrine: Negative for cold or heat intolerance, polyuria, polydipsia and goiter. Neuro: negative for tremor, gait imbalance, syncope and seizures. The remainder of the review of systems is noncontributory.  Physical Exam: BP 117/81 (BP Location: Left Arm, Patient Position: Sitting, Cuff Size: Large)   Pulse 79   Temp 97.8 F (36.6 C) (Oral)   Ht '5\' 11"'$  (1.803 m)   Wt 214 lb 14.4 oz (97.5 kg)   BMI 29.97 kg/m  General:   Alert and oriented. No distress noted. Pleasant and cooperative.  Head:  Normocephalic and atraumatic. Eyes:  Conjuctiva clear without scleral icterus. Mouth:  Oral mucosa pink and moist. Good dentition. No lesions. Heart: Normal rate and rhythm, s1 and s2 heart sounds present.  Lungs: Clear lung sounds in all lobes. Respirations equal and unlabored. Abdomen:  +BS, soft, mild TTP of RUQ and non-distended. No rebound or  guarding. No HSM or masses noted. Derm: No palmar erythema or jaundice Msk:  Symmetrical without gross deformities. Normal posture. Extremities:  Without edema. Neurologic:  Alert and  oriented x4 Psych:  Alert and cooperative. Normal mood and affect.  Invalid input(s): "6 MONTHS"   ASSESSMENT: Juan Murphy is a 46 y.o. male presenting today for follow up of GERD/Dysphagia.  GERD symptoms have improved since starting protonix '40mg'$  daily, though still has some issues with dysphagia with thicker foods or foods like rice or popcorn, feels this may have improved some since EGD with dilation. No esophageal abnormalities to explain dysphagia. May need to consider esophageal manometry if he continues with have issues with this.  Constipation improved with use of benefiber, however, has some times of abdominal cramping, fecal  urgency and looser stools. Is trying to pinpoint specific causes of this and does note more stress recently. Abdominal cramping improves with a BM. Denies rectal bleeding, melena or unintentional weight loss. Suspect this is IBS related.  I explained indications of IBS with the patient to include hypersensitivity of the bowels that is often heavily influenced by certain food/drink triggers and emotional/mental triggers. We discussed the importance of keeping a food journal x2-3 weeks and utilizing the low FODMAP guide to help determine specific trigger foods and increasing foods that tend to be more well tolerated. Will continue benefiber 2-3x/day with good result.   Notably also having some intermittent RUQ pain that sometimes radiates to his upper back and some nausea. This has been on and off for the past maybe 6 months. Denies vomiting. Unsure of specific precipitating factors. RUQ is mildly tender to palpation on exam. Will order RUQ [place] to rule out GB etiology.    PLAN:  RUQ [place]  2. Continue benefiber 2-3x/day, good water intake  3. Low FODMAP diet, food/symptoms journal, stress management  4. Continue PPI daily and reflux precautions 5. Consider esophageal manometry if dysphagia persists  All questions were answered, patient verbalized understanding and is in agreement with plan as outlined above.    Follow Up: 6 months   Amanat Hackel L. Alver Sorrow, MSN, APRN, AGNP-C Adult-Gerontology Nurse Practitioner [facility]  I have reviewed the note and agree with the APP's assessment as described in this progress note  Maylon Peppers, MD Gastroenterology and Hepatology [facility]

## 2022-06-23 ENCOUNTER — Other Ambulatory Visit (INDEPENDENT_AMBULATORY_CARE_PROVIDER_SITE_OTHER): Payer: Self-pay | Admitting: Gastroenterology

## 2022-06-23 ENCOUNTER — Ambulatory Visit (HOSPITAL_COMMUNITY)
Admission: RE | Admit: 2022-06-23 | Discharge: 2022-06-23 | Disposition: A | Discharge status: Home / Self Care | Payer: BC Managed Care – PPO | Source: Ambulatory Visit | Attending: Gastroenterology | Admitting: Gastroenterology

## 2022-06-23 DIAGNOSIS — K838 Other specified diseases of biliary tract: Secondary | ICD-10-CM

## 2022-06-23 DIAGNOSIS — R1011 Right upper quadrant pain: Secondary | ICD-10-CM | POA: Insufficient documentation

## 2022-06-24 ENCOUNTER — Other Ambulatory Visit: Payer: BC Managed Care – PPO

## 2022-06-24 DIAGNOSIS — E291 Testicular hypofunction: Secondary | ICD-10-CM

## 2022-06-28 ENCOUNTER — Other Ambulatory Visit: Payer: BC Managed Care – PPO

## 2022-06-28 LAB — COMPREHENSIVE METABOLIC PANEL
ALT: 34 IU/L (ref 0–44)
AST: 21 IU/L (ref 0–40)
Albumin/Globulin Ratio: 2 (ref 1.2–2.2)
Albumin: 4.5 g/dL (ref 4.1–5.1)
Alkaline Phosphatase: 55 IU/L (ref 44–121)
BUN/Creatinine Ratio: 13 (ref 9–20)
BUN: 14 mg/dL (ref 6–24)
Bilirubin Total: 0.4 mg/dL (ref 0.0–1.2)
CO2: 26 mmol/L (ref 20–29)
Calcium: 9.6 mg/dL (ref 8.7–10.2)
Chloride: 100 mmol/L (ref 96–106)
Creatinine, Ser: 1.05 mg/dL (ref 0.76–1.27)
Globulin, Total: 2.3 g/dL (ref 1.5–4.5)
Glucose: 84 mg/dL (ref 70–99)
Potassium: 4.2 mmol/L (ref 3.5–5.2)
Sodium: 141 mmol/L (ref 134–144)
Total Protein: 6.8 g/dL (ref 6.0–8.5)
eGFR: 89 mL/min/{1.73_m2} (ref >59)

## 2022-06-28 LAB — CBC WITH DIFFERENTIAL
Basophils Absolute: 0.1 10*3/uL (ref 0.0–0.2)
Basos: 1 %
EOS (ABSOLUTE): 0.2 10*3/uL (ref 0.0–0.4)
Eos: 3 %
Hematocrit: 45.4 % (ref 37.5–51.0)
Hemoglobin: 15.5 g/dL (ref 13.0–17.7)
Immature Grans (Abs): 0 10*3/uL (ref 0.0–0.1)
Immature Granulocytes: 0 %
Lymphocytes Absolute: 2 10*3/uL (ref 0.7–3.1)
Lymphs: 32 %
MCH: 29.2 pg (ref 26.6–33.0)
MCHC: 34.1 g/dL (ref 31.5–35.7)
MCV: 86 fL (ref 79–97)
Monocytes Absolute: 0.8 10*3/uL (ref 0.1–0.9)
Monocytes: 12 %
Neutrophils Absolute: 3.3 10*3/uL (ref 1.4–7.0)
Neutrophils: 52 %
RBC: 5.31 x10E6/uL (ref 4.14–5.80)
RDW: 13.8 % (ref 11.6–15.4)
WBC: 6.4 10*3/uL (ref 3.4–10.8)

## 2022-06-28 LAB — TESTOSTERONE,FREE AND TOTAL
Testosterone, Free: 18.8 pg/mL (ref 6.8–21.5)
Testosterone: 625 ng/dL (ref 264–916)

## 2022-06-28 LAB — ESTRADIOL: Estradiol: 43 pg/mL — ABNORMAL HIGH (ref 7.6–42.6)

## 2022-07-07 NOTE — Progress Notes (Signed)
Letter sent.

## 2022-07-08 ENCOUNTER — Encounter: Payer: Self-pay | Admitting: Urology

## 2022-07-08 ENCOUNTER — Ambulatory Visit (INDEPENDENT_AMBULATORY_CARE_PROVIDER_SITE_OTHER): Payer: BC Managed Care – PPO | Admitting: Urology

## 2022-07-08 ENCOUNTER — Telehealth: Payer: Self-pay

## 2022-07-08 VITALS — BP 124/83 | HR 87

## 2022-07-08 DIAGNOSIS — E291 Testicular hypofunction: Secondary | ICD-10-CM | POA: Diagnosis not present

## 2022-07-08 DIAGNOSIS — N200 Calculus of kidney: Secondary | ICD-10-CM

## 2022-07-08 DIAGNOSIS — R3912 Poor urinary stream: Secondary | ICD-10-CM | POA: Diagnosis not present

## 2022-07-08 LAB — URINALYSIS, ROUTINE W REFLEX MICROSCOPIC
Bilirubin, UA: NEGATIVE
Glucose, UA: NEGATIVE
Ketones, UA: NEGATIVE
Leukocytes,UA: NEGATIVE
Nitrite, UA: NEGATIVE
Protein,UA: NEGATIVE
RBC, UA: NEGATIVE
Specific Gravity, UA: 1.025 (ref 1.005–1.030)
Urobilinogen, Ur: 0.2 mg/dL (ref 0.2–1.0)
pH, UA: 5.5 (ref 5.0–7.5)

## 2022-07-08 MED ORDER — HYDROXYZINE HCL 10 MG PO TABS: 10.0000 mg | ORAL_TABLET | Freq: Every day | ORAL | 11 refills | Status: AC

## 2022-07-08 MED ORDER — CLOMIPHENE CITRATE 50 MG PO TABS: 25.0000 mg | ORAL_TABLET | Freq: Every day | ORAL | 3 refills | Status: DC

## 2022-07-08 MED ORDER — SILODOSIN 8 MG PO CAPS: 8.0000 mg | ORAL_CAPSULE | Freq: Every day | ORAL | 11 refills | Status: DC

## 2022-07-08 NOTE — Progress Notes (Signed)
07/08/2022 1:23 PM   Juan Murphy 01-29-1976 [id number]  Referring provider: Curlene Labrum, MD Juan,  [place] [id number]  Followup hypogonadism, BPh and nephrolithiasis   HPI: [name] is a 45yo here for followup for hypogonadism, BPH and nephrolithiasis. Testosterone 625, estradiol 43, hemoglobin 15.5 on clomid '25mg'$  daily. Energy good. Libido good. IPSS 04 QOl 0 on rapaflo '8mg'$  daily. Urine stream strong. No straining to urinate. No stone events since last visit.    PMH: Past Medical History:  Diagnosis Date   GERD (gastroesophageal reflux disease)    Kidney stone    Plantar fasciitis     Surgical History: Past Surgical History:  Procedure Laterality Date   BIOPSY  04/12/2022   Procedure: BIOPSY;  Surgeon: Harvel Quale, MD;  [facility];  Service: Gastroenterology;;   COLONOSCOPY WITH PROPOFOL N/A 04/12/2022   Procedure: COLONOSCOPY WITH PROPOFOL;  Surgeon: Harvel Quale, MD;  [facility];  Service: Gastroenterology;  Laterality: N/A;  150 ASA 2   CYSTOSCOPY     ESOPHAGOGASTRODUODENOSCOPY (EGD) WITH PROPOFOL N/A 04/12/2022   Procedure: ESOPHAGOGASTRODUODENOSCOPY (EGD) WITH PROPOFOL;  Surgeon: Harvel Quale, MD;  [facility];  Service: Gastroenterology;  Laterality: N/A;   POLYPECTOMY  04/12/2022   Procedure: POLYPECTOMY;  Surgeon: Harvel Quale, MD;  [facility];  Service: Gastroenterology;;   SHOULDER SURGERY Right    dec 2022   TONSILLECTOMY      Home Medications:  Allergies as of 07/08/2022   No Known Allergies      Medication List        Accurate as of July 08, 2022  1:23 PM. If you have any questions, ask your nurse or doctor.          clomiPHENE 50 MG tablet Commonly known as: CLOMID Take 0.5 tablets (25 mg total) by mouth daily.   cyclobenzaprine 5 MG tablet Commonly known as: FLEXERIL Take 1 tablet (5 mg total) by mouth 3 (three)  times daily as needed for muscle spasms.   hydrOXYzine 10 MG tablet Commonly known as: ATARAX Take 1 tablet (10 mg total) by mouth at bedtime. Started by: Nicolette Bang, MD   indapamide 1.25 MG tablet Commonly known as: LOZOL Take 1.25 mg by mouth daily.   meloxicam 15 MG tablet Commonly known as: MOBIC Take 15 mg by mouth daily as needed for pain.   pantoprazole 40 MG tablet Commonly known as: PROTONIX TAKE 1 TABLET BY MOUTH ONCE DAILY BEFORE BREAKFAST   silodosin 8 MG Caps capsule Commonly known as: RAPAFLO Take 1 capsule (8 mg total) by mouth at bedtime. Started by: Nicolette Bang, MD   tamsulosin 0.4 MG Caps capsule Commonly known as: FLOMAX Take 1 capsule (0.4 mg total) by mouth daily after supper. What changed: when to take this        Allergies: No Known Allergies  Family History: Family History  Problem Relation Age of Onset   Colonic polyp Father    Hypertension Father    Colon polyps Sister     Social History:  reports that he has never smoked. He has been exposed to tobacco smoke. He has never used smokeless tobacco. He reports that he does not drink alcohol and does not use drugs.  ROS: All other review of systems were reviewed and are negative except what is noted above in HPI  Physical Exam: BP 124/83   Pulse 87   Constitutional:  Alert  and oriented, No acute distress. HEENT: [place] AT, moist mucus membranes.  Trachea midline, no masses. Cardiovascular: No clubbing, cyanosis, or edema. Respiratory: Normal respiratory effort, no increased work of breathing. GI: Abdomen is soft, nontender, nondistended, no abdominal masses GU: No CVA tenderness.  Lymph: No cervical or inguinal lymphadenopathy. Skin: No rashes, bruises or suspicious lesions. Neurologic: Grossly intact, no focal deficits, moving all 4 extremities. Psychiatric: Normal mood and affect.  Laboratory Data: Lab Results  Component Value Date   WBC 6.4 06/24/2022   HGB 15.5  06/24/2022   HCT 45.4 06/24/2022   MCV 86 06/24/2022    Lab Results  Component Value Date   CREATININE 1.05 06/24/2022    No results found for: "PSA"  Lab Results  Component Value Date   TESTOSTERONE 625 06/24/2022    No results found for: "HGBA1C"  Urinalysis    Component Value Date/Time   APPEARANCEUR Clear 04/06/2022 1549   GLUCOSEU Negative 04/06/2022 1549   BILIRUBINUR Negative 04/06/2022 1549   PROTEINUR Negative 04/06/2022 1549   NITRITE Negative 04/06/2022 1549   LEUKOCYTESUR Negative 04/06/2022 1549    Lab Results  Component Value Date   LABMICR Comment 04/06/2022   WBCUA 0-5 11/05/2020   LABEPIT None seen 11/05/2020   BACTERIA None seen 11/05/2020    Pertinent Imaging:  Results for orders placed in visit on 03/08/22  Abdomen 1 view (KUB)  Narrative CLINICAL DATA:  History of kidney stones  EXAM: ABDOMEN - 1 VIEW  COMPARISON:  CT abdomen and pelvis 08/20/2020  FINDINGS: The bowel gas pattern is normal. No radio-opaque calculi or other significant radiographic abnormality are seen. The 7 mm left renal calculus on CT 08/20/2020 is not confidently visualized.  IMPRESSION: Negative.   Electronically Signed By: Placido Sou M.D. On: 03/08/2022 18:49  No results found for this or any previous visit.  No results found for this or any previous visit.  No results found for this or any previous visit.  No results found for this or any previous visit.  No valid procedures specified. No results found for this or any previous visit.  Results for orders placed in visit on 04/06/22  CT RENAL STONE STUDY  Narrative CLINICAL DATA:  Left flank pain.  Nephrolithiasis.  EXAM: CT ABDOMEN AND PELVIS WITHOUT CONTRAST  TECHNIQUE: Multidetector CT imaging of the abdomen and pelvis was performed following the standard protocol without IV contrast.  RADIATION DOSE REDUCTION: This exam was performed according to the departmental  dose-optimization program which includes automated exposure control, adjustment of the mA and/or kV according to patient size and/or use of iterative reconstruction technique.  COMPARISON:  08/20/2020  FINDINGS: Lower chest: No acute findings.  Hepatobiliary: No mass visualized on this unenhanced exam. Gallbladder is unremarkable. No evidence of biliary ductal dilatation.  Pancreas: No mass or inflammatory process visualized on this unenhanced exam.  Spleen:  Within normal limits in size.  Adrenals/Urinary tract: A few tiny 1-2 mm nonobstructing calculi are again seen in the lower pole of left kidney. No evidence of ureteral calculi or hydronephrosis. Unremarkable unopacified urinary bladder.  Stomach/Bowel: No evidence of obstruction, inflammatory process, or abnormal fluid collections. Normal appendix visualized.  Vascular/Lymphatic: No pathologically enlarged lymph nodes identified. No evidence of abdominal aortic aneurysm.  Reproductive:  No mass or other significant abnormality.  Other:  None.  Musculoskeletal:  No suspicious bone lesions identified.  IMPRESSION: Tiny nonobstructing left renal calculi. No evidence of ureteral calculi, hydronephrosis, or other acute findings.   Electronically Signed  By: Marlaine Hind M.D. On: 04/15/2022 15:57   Assessment & Plan:    1. Hypogonadism male - continue clomid '25mg'$  daily - Testosterone,Free and Total; Future - Comprehensive metabolic panel; Future - CBC With differential/Platelet; Future  2. Kidney stones -Followup 6 months renal US - Urinalysis, Routine w reflex microscopic  3. Weak urinary stream -rapaflo '8mg'$  daily   Return in about 6 months (around 01/07/2023).  Nicolette Bang, MD  [facility] [place]

## 2022-07-08 NOTE — Telephone Encounter (Signed)
Patient states you mentioned a B12 shot, he is asking if this is something that he needs to have his PCP do this or if this is something that will be done here.

## 2022-07-08 NOTE — Patient Instructions (Signed)

## 2022-07-11 NOTE — Telephone Encounter (Signed)
Pt aware, he will contact his PCP

## 2022-07-26 ENCOUNTER — Telehealth: Payer: Self-pay

## 2022-07-26 NOTE — Telephone Encounter (Signed)
Patient having right testicular pain and wanted to know if you could call him to answer some questions he had.    [phone]

## 2022-07-27 ENCOUNTER — Telehealth: Payer: Self-pay

## 2022-07-27 NOTE — Telephone Encounter (Signed)
Carla from Dr. Lizbeth Bark office called advising that they administered a B-12 injection for patient. They wanted to know how often you recommended he receive those injections.

## 2022-07-29 ENCOUNTER — Other Ambulatory Visit: Payer: Self-pay | Admitting: Urology

## 2022-07-29 MED ORDER — SULFAMETHOXAZOLE-TRIMETHOPRIM 800-160 MG PO TABS: 1.0000 | ORAL_TABLET | Freq: Two times a day (BID) | ORAL | 0 refills | Status: AC

## 2022-10-06 ENCOUNTER — Ambulatory Visit: Payer: BC Managed Care – PPO | Admitting: Internal Medicine

## 2022-12-15 ENCOUNTER — Encounter (INDEPENDENT_AMBULATORY_CARE_PROVIDER_SITE_OTHER): Payer: Self-pay | Admitting: Gastroenterology

## 2022-12-15 ENCOUNTER — Ambulatory Visit (INDEPENDENT_AMBULATORY_CARE_PROVIDER_SITE_OTHER): Payer: BC Managed Care – PPO | Admitting: Gastroenterology

## 2022-12-15 VITALS — BP 129/87 | HR 98 | Temp 97.8°F | Ht 71.0 in | Wt 217.5 lb

## 2022-12-15 DIAGNOSIS — K219 Gastro-esophageal reflux disease without esophagitis: Secondary | ICD-10-CM

## 2022-12-15 DIAGNOSIS — K582 Mixed irritable bowel syndrome: Secondary | ICD-10-CM

## 2022-12-15 DIAGNOSIS — R1011 Right upper quadrant pain: Secondary | ICD-10-CM

## 2022-12-15 DIAGNOSIS — R1310 Dysphagia, unspecified: Secondary | ICD-10-CM | POA: Diagnosis not present

## 2022-12-15 MED ORDER — PANTOPRAZOLE SODIUM 40 MG PO TBEC: 40.0000 mg | DELAYED_RELEASE_TABLET | Freq: Every day | ORAL | 3 refills | Status: DC

## 2022-12-15 NOTE — Progress Notes (Addendum)
Referring Provider: Juliette Alcide, MD Primary Care Physician:  Juliette Alcide, MD Primary GI Physician: Levon Hedger   Chief Complaint  Patient presents with   Follow-up    Patient here today for a follow up. He says he has had some chest pain for some time he had cardiac work up at [employer], per patient says the work up was negative. He is having issues with constipation. He says he takes metamucil daily at breakfast.   HPI:   Juan Murphy is a 47 y.o. male with past medical history of GERD, renal calculi, plantar fasciitis, IBS, vitamin D deficiency, B12 deficiency, sleep apnea/cpap  Patient presenting today for follow up of GERD, RUQ pain, IBS and Dysphagia   Last seen November 2023, at that time heartburn and acid regurgitation rare. having some dysphagia still, usually with certain foods like popcorn or rice, sometimes thicker, drier foods. Sometimes has to cough food back up, sometimes can get it to pass. Feels it has improved some since EGD with dilation. Denies odynophagia.    Previous celiac testing was negative. Constipation has improved since starting benefiber once daily. sometimes he has some fecal urgency after certain foods, also with some abdominal pain and looser stools during those times. Abdominal pain improves after having a BM. Trying to keep track of which foods tend to cause these issues. Notes more stress recently with campaigning as well.    Has occasional RUQ pain, sometimes radiates to his back, also with associated nausea. This has been ongoing maybe 6 months.  Recommended RUQ [place], continue benefiber 2-3x/day, low FODMAP diet, PPI daily, consider esophageal manometry if dysphagia persists.  RUQ [place] with increased hepatic echogenicity, suspect hepatic steatosis, CBD upper limits of normal at 6.56mm, GB normal. Recommended to have updated LFTs to correlate which were normal.  Present: Notes he is doing pretty well, still feeling fatigued. Has seen  endocrinology. Has Vit D deficiency and B12 deficiency which he is on supplements for. Also notes diagnosed with sleep apnea and wearing a CPAP. Still unclear why he is so fatigued.  He has had some issues with his BP.   He notes that he feels like he needs to have more BMs than he is having currently. Having a BM every other day, sometimes with more constipation. He thinks he is doing miralax once a day (description of bottle sounds more like benefiber?). Previously did benefiber which helped. Notes recently he was away for work stuff and did not take as much at that time, thinks that may be why he has felt a little more constipated recently. he has some abdominal discomfort prior to having a BM, sometimes improves with defecation. He is still having some RUQ pain intermittently, unclear what precipitates these episodes, pain resolves on its own. No nausea or vomiting. No rectal bleeding or melena.   His GERD is pretty well managed on protonix 40mg  daily,  has breakthrough on rare occasion if he eats something spicy or citrus but tries to avoid these things. Not having much issue with dysphagia unless he eats something crumbly like crackers or chips so he tries to avoid these.   He is trying to work out and change his diet to lose weight.   Last Colonoscopy:- 04/12/2022 Three 4 to 8 mm polyps in the transverse colon and in the ascending colon, sessile polyps  - Non-bleeding internal hemorrhoids. Last Endoscopy:- 04/12/2022 No endoscopic esophageal abnormality to explain patient's dysphagia. Esophagus dilated.Dilated. Biopsied-reflux changes - LA Grade A  reflux esophagitis with no bleeding. - A few fundic gland polyps. - Normal examined duodenum. Biopsied-normal    Recommendations:  Repeat TCS 3 years   Past Medical History:  Diagnosis Date   GERD (gastroesophageal reflux disease)    Kidney stone    Plantar fasciitis     Past Surgical History:  Procedure Laterality Date   BIOPSY  04/12/2022    Procedure: BIOPSY;  Surgeon: Dolores Frame, MD;  [facility];  Service: Gastroenterology;;   COLONOSCOPY WITH PROPOFOL N/A 04/12/2022   Procedure: COLONOSCOPY WITH PROPOFOL;  Surgeon: Dolores Frame, MD;  [facility];  Service: Gastroenterology;  Laterality: N/A;  150 ASA 2   CYSTOSCOPY     ESOPHAGOGASTRODUODENOSCOPY (EGD) WITH PROPOFOL N/A 04/12/2022   Procedure: ESOPHAGOGASTRODUODENOSCOPY (EGD) WITH PROPOFOL;  Surgeon: Dolores Frame, MD;  [facility];  Service: Gastroenterology;  Laterality: N/A;   POLYPECTOMY  04/12/2022   Procedure: POLYPECTOMY;  Surgeon: Marguerita Merles, Reuel Boom, MD;  [facility];  Service: Gastroenterology;;   SHOULDER SURGERY Right    dec 2022   TONSILLECTOMY      Current Outpatient Medications  Medication Sig Dispense Refill   acetaminophen (TYLENOL) 325 MG tablet Take 650 mg by mouth every 6 (six) hours as needed.     amphetamine-dextroamphetamine (ADDERALL) 10 MG tablet Take by mouth.     Cyanocobalamin 1000 MCG/ML KIT Inject as directed every 30 (thirty) days.     ergocalciferol (VITAMIN D2) 1.25 MG (50000 UT) capsule Take 50,000 Units by mouth once a week. Q friday     hydrOXYzine (ATARAX) 10 MG tablet Take 1 tablet (10 mg total) by mouth at bedtime. 30 tablet 11   meloxicam (MOBIC) 15 MG tablet Take 15 mg by mouth daily as needed for pain.     Multiple Vitamin (MULTIVITAMIN) capsule Take 1 capsule by mouth daily.     pantoprazole (PROTONIX) 40 MG tablet TAKE 1 TABLET BY MOUTH ONCE DAILY BEFORE BREAKFAST 30 tablet 0   sulfamethoxazole-trimethoprim (BACTRIM DS) 800-160 MG tablet Take 1 tablet by mouth every 12 (twelve) hours. 56 tablet 0   tamsulosin (FLOMAX) 0.4 MG CAPS capsule Take 1 capsule (0.4 mg total) by mouth daily after supper. (Patient taking differently: Take 0.4 mg by mouth at bedtime.) 30 capsule 11   indapamide (LOZOL) 1.25 MG tablet Take 1.25 mg by mouth daily.     No  current facility-administered medications for this visit.    Allergies as of 12/15/2022   (No Known Allergies)    Family History  Problem Relation Age of Onset   Colonic polyp Father    Hypertension Father    Colon polyps Sister     Social History   Socioeconomic History   Marital status: Divorced    Spouse name: Not on file   Number of children: Not on file   Years of education: Not on file   Highest education level: Not on file  Occupational History   Not on file  Tobacco Use   Smoking status: Never    Passive exposure: Past   Smokeless tobacco: Never  Vaping Use   Vaping Use: Never used  Substance and Sexual Activity   Alcohol use: Never   Drug use: Never   Sexual activity: Yes  Other Topics Concern   Not on file  Social History Narrative   Not on file   Social Determinants of Health   Financial Resource Strain: Not on file  Food Insecurity: Not  on file  Transportation Needs: Not on file  Physical Activity: Not on file  Stress: Not on file  Social Connections: Not on file    Review of systems General: negative for malaise, night sweats, fever, chills, weight loss Neck: Negative for lumps, goiter, pain and significant neck swelling Resp: Negative for cough, wheezing, dyspnea at rest CV: Negative for chest pain, leg swelling, palpitations, orthopnea GI: denies melena, hematochezia, nausea, vomiting, diarrhea, odynophagia, early satiety or unintentional weight loss. +RUQ Pain +constipation +occasional dysphagia  MSK: Negative for joint pain or swelling, back pain, and muscle pain. Derm: Negative for itching or rash Psych: Denies depression, anxiety, memory loss, confusion. No homicidal or suicidal ideation.  Heme: Negative for prolonged bleeding, bruising easily, and swollen nodes. Endocrine: Negative for cold or heat intolerance, polyuria, polydipsia and goiter. Neuro: negative for tremor, gait imbalance, syncope and seizures. The remainder of the review  of systems is noncontributory.  Physical Exam: BP 129/87 (BP Location: Right Arm, Patient Position: Sitting, Cuff Size: Large)   Pulse 98   Temp 97.8 F (36.6 C) (Temporal)   Ht 5\' 11"  (1.803 m)   Wt 217 lb 8 oz (98.7 kg)   BMI 30.34 kg/m  General:   Alert and oriented. No distress noted. Pleasant and cooperative.  Head:  Normocephalic and atraumatic. Eyes:  Conjuctiva clear without scleral icterus. Mouth:  Oral mucosa pink and moist. Good dentition. No lesions. Heart: Normal rate and rhythm, s1 and s2 heart sounds present.  Lungs: Clear lung sounds in all lobes. Respirations equal and unlabored. Abdomen:  +BS, soft, non-tender and non-distended. No rebound or guarding. No HSM or masses noted. Derm: No palmar erythema or jaundice Msk:  Symmetrical without gross deformities. Normal posture. Extremities:  Without edema. Neurologic:  Alert and  oriented x4 Psych:  Alert and cooperative. Normal mood and affect.  Invalid input(s): "6 MONTHS"   ASSESSMENT: Juan Murphy is a 47 y.o. male presenting today for follow up of GERD, IBS, Dysphagia and RUQ pain.   RUQ Pain: intermittent, no precipitating or alleviating factors. RUQ [place] without GB etiology. Mildly dilated CBD at 6.40mm, LFTs have been WNL. No nausea or vomiting. Recent EGD without findings to explain symptoms. Query if this is related to his IBS. If pain persists or other associated symptoms, could consider further investigation with CT A/P.   IBS-M: with more constipation recently. Thinks he may be taking miralax, but took benefiber with good results in the past. Recommended good water intake, diet high in fruits, veggies, and resume benefiber 1T 2-3x/day with meals.   GERD/Dysphagia: GERD is pretty well controlled, notes occasional breakthrough with trigger foods such as spicy, tomato based or citrus foods. Tries to mostly avoid these. Dysphagia rarely if he eats something crumbly like crackers or chips which he also tries  to avoid. Overall feels he is doing pretty well on protonix 40mg  daily. Will continue with current regimen, good reflux precautions.   PLAN:  Continue protonix 40mg  daily, reflux precautions  2. Increase water intake, aim for atleast 64 oz per day Increase fruits, veggies and whole grains, kiwi and prunes are especially good for constipation 3. Benefiber 1T 2-3x/day with meals 4. Consider CT A/P if RUQ pain persists 5. Pt to make me aware if dysphagia worsens, consider esophageal manometry  All questions were answered, patient verbalized understanding and is in agreement with plan as outlined above.   Follow Up: 6 months   Doyl Bitting L. Jeanmarie Hubert, MSN, APRN, AGNP-C Adult-Gerontology Nurse Practitioner  [facility]  I have reviewed the note and agree with the APP's assessment as described in this progress note  Katrinka Blazing, MD Gastroenterology and Hepatology [facility]

## 2022-12-15 NOTE — Patient Instructions (Addendum)
Please continue with protonix 40mg  once daily You can try benefiber 1T 2-3x/day with a meal Increase water intake, aim for atleast 64 oz per day Increase fruits, veggies and whole grains, kiwi and prunes are especially good for constipation If swallowing issues worsen, please make me aware.  If pain in your right abdomen persists, or becomes worse, we can consider further testing  Follow up 6 months  It was a pleasure to see you today. I want to create trusting relationships with patients and provide genuine, compassionate, and quality care. I truly value your feedback! please be on the lookout for a survey regarding your visit with me today. I appreciate your input about our visit and your time in completing this!    Murice Barbar L. Jeanmarie Hubert, MSN, APRN, AGNP-C Adult-Gerontology Nurse Practitioner [facility]

## 2023-01-04 ENCOUNTER — Other Ambulatory Visit: Payer: BC Managed Care – PPO

## 2023-01-11 ENCOUNTER — Ambulatory Visit: Payer: BC Managed Care – PPO | Admitting: Urology

## 2023-01-16 ENCOUNTER — Ambulatory Visit: Payer: BC Managed Care – PPO | Admitting: Urology

## 2023-02-16 ENCOUNTER — Ambulatory Visit: Payer: BC Managed Care – PPO | Admitting: Urology

## 2023-02-21 ENCOUNTER — Other Ambulatory Visit: Payer: Self-pay

## 2023-02-21 DIAGNOSIS — E291 Testicular hypofunction: Secondary | ICD-10-CM

## 2023-02-22 ENCOUNTER — Ambulatory Visit: Payer: BC Managed Care – PPO | Admitting: Urology

## 2023-04-19 ENCOUNTER — Ambulatory Visit: Payer: BC Managed Care – PPO | Admitting: Urology

## 2023-05-03 ENCOUNTER — Ambulatory Visit: Payer: BC Managed Care – PPO | Admitting: Urology

## 2023-05-03 VITALS — BP 129/89 | HR 84

## 2023-05-03 DIAGNOSIS — R3912 Poor urinary stream: Secondary | ICD-10-CM

## 2023-05-03 DIAGNOSIS — N200 Calculus of kidney: Secondary | ICD-10-CM

## 2023-05-03 DIAGNOSIS — E291 Testicular hypofunction: Secondary | ICD-10-CM | POA: Diagnosis not present

## 2023-05-03 MED ORDER — JATENZO 237 MG PO CAPS
1.0000 | ORAL_CAPSULE | Freq: Two times a day (BID) | ORAL | 5 refills | Status: DC
Start: 2023-05-03 — End: 2023-12-14

## 2023-05-04 LAB — URINALYSIS, ROUTINE W REFLEX MICROSCOPIC
Bilirubin, UA: NEGATIVE
Glucose, UA: NEGATIVE
Ketones, UA: NEGATIVE
Leukocytes,UA: NEGATIVE
Nitrite, UA: NEGATIVE
Protein,UA: NEGATIVE
RBC, UA: NEGATIVE
Specific Gravity, UA: <1.005 — ABNORMAL LOW (ref 1.005–1.030)
Urobilinogen, Ur: 0.2 mg/dL (ref 0.2–1.0)
pH, UA: 6.5 (ref 5.0–7.5)

## 2023-05-07 ENCOUNTER — Encounter: Payer: Self-pay | Admitting: Urology

## 2023-05-07 MED ORDER — TAMSULOSIN HCL 0.4 MG PO CAPS: 0.4000 mg | ORAL_CAPSULE | Freq: Every day | ORAL | 3 refills | Status: AC

## 2023-05-07 NOTE — Patient Instructions (Signed)

## 2023-05-22 ENCOUNTER — Ambulatory Visit: Payer: BC Managed Care – PPO | Admitting: Urology

## 2023-05-22 ENCOUNTER — Telehealth: Payer: Self-pay

## 2023-05-22 ENCOUNTER — Other Ambulatory Visit: Payer: Self-pay

## 2023-05-22 DIAGNOSIS — E291 Testicular hypofunction: Secondary | ICD-10-CM

## 2023-05-22 NOTE — Telephone Encounter (Signed)
Jatenzo PA started  [employer]

## 2023-05-24 NOTE — Telephone Encounter (Signed)
PA approved and patient aware.

## 2023-05-24 NOTE — Telephone Encounter (Signed)
Pending

## 2023-06-15 ENCOUNTER — Ambulatory Visit (INDEPENDENT_AMBULATORY_CARE_PROVIDER_SITE_OTHER): Payer: BC Managed Care – PPO | Admitting: Gastroenterology

## 2023-06-15 ENCOUNTER — Encounter (INDEPENDENT_AMBULATORY_CARE_PROVIDER_SITE_OTHER): Payer: Self-pay | Admitting: Gastroenterology

## 2023-06-15 VITALS — BP 103/72 | HR 125 | Temp 97.8°F | Ht 71.0 in | Wt 220.6 lb

## 2023-06-15 DIAGNOSIS — K582 Mixed irritable bowel syndrome: Secondary | ICD-10-CM | POA: Diagnosis not present

## 2023-06-15 DIAGNOSIS — R1310 Dysphagia, unspecified: Secondary | ICD-10-CM | POA: Diagnosis not present

## 2023-06-15 DIAGNOSIS — K219 Gastro-esophageal reflux disease without esophagitis: Secondary | ICD-10-CM | POA: Diagnosis not present

## 2023-06-15 MED ORDER — DICYCLOMINE HCL 10 MG PO CAPS
10.0000 mg | ORAL_CAPSULE | Freq: Two times a day (BID) | ORAL | 1 refills | Status: AC | PRN
Start: 2023-06-15 — End: ?

## 2023-06-15 NOTE — Patient Instructions (Addendum)
-   Explained presumed etiology of IBS symptoms. Patient was counseled about the benefit of implementing a low FODMAP to improve symptoms and recurrent episodes. A dietary list was provided to the patient. Also, the patient was counseled about the benefit of avoiding stressing situations and potential environmental triggers leading to symptomatology. - Start Bentyl 1 tablet q12h as needed for abdominal pain - Start IBGard 1 tablet every 8 hours as needed for bloating. Can use peppermint tea as well. - Continue pantoprazole 40 mg qday

## 2023-06-15 NOTE — Progress Notes (Signed)
Katrinka Blazing, M.D. Gastroenterology & Hepatology [facility] Gastroenterology [address] [place], [place] [address]  Primary Care Physician: Juliette Alcide, MD [address] [place] [address]  I will communicate my assessment and recommendations to the referring MD via EMR.  Problems: GERD Dysphagia IBS-M  History of Present Illness: Juan Murphy is a 47 y.o. male with past medical history of GERD, renal calculi, plantar fasciitis, IBS, vitamin D deficiency, B12 deficiency, sleep apnea/cpap,  who presents for follow up of IBS, GERD and dysphagia.  The patient was last seen on 12/15/2022. At that time, the patient was continued on Protonix 40 mg every day and Benefiber for IBS.  Patient reports that for the last couple of months he has pain episodes in his RUQ. He has intermittent pain in his RUQ, but also has pain in his lower abdomen which he describes as "spasms" (these are less frequent). States tat a few times he has had a few episodes when he pain has woken him up in the middle of the night. States he is moving his bowels up to 4 times per day. He also has more flatulence. He has also presented more bloating.  States he was under a lot stress as he was running for election for his job.  He thinks that these will improve as he won the most recent election.  No heartburn. States that as long as he takes Protonix, he won't have any regurgitation. No odynophagia. May have some midl issues when swallowing crackers or bread.  The patient denies having any nausea, vomiting, fever, chills, hematochezia, melena, hematemesis, jaundice, pruritus or weight loss.  Last Colonoscopy:- 04/12/2022 Three 4 to 8 mm polyps in the transverse colon and in the ascending colon, sessile polyps  - Non-bleeding internal hemorrhoids. Last Endoscopy:- 04/12/2022 No endoscopic esophageal abnormality to explain patient's dysphagia. Esophagus dilated.Dilated. Biopsied-reflux  changes - LA Grade A reflux esophagitis with no bleeding. - A few fundic gland polyps. - Normal examined duodenum. Biopsied-normal    Recommendations:  Repeat TCS 3 years  Past Medical History: Past Medical History:  Diagnosis Date   GERD (gastroesophageal reflux disease)    Kidney stone    Plantar fasciitis     Past Surgical History: Past Surgical History:  Procedure Laterality Date   BIOPSY  04/12/2022   Procedure: BIOPSY;  Surgeon: Dolores Frame, MD;  [facility];  Service: Gastroenterology;;   COLONOSCOPY WITH PROPOFOL N/A 04/12/2022   Procedure: COLONOSCOPY WITH PROPOFOL;  Surgeon: Dolores Frame, MD;  [facility];  Service: Gastroenterology;  Laterality: N/A;  150 ASA 2   CYSTOSCOPY     ESOPHAGOGASTRODUODENOSCOPY (EGD) WITH PROPOFOL N/A 04/12/2022   Procedure: ESOPHAGOGASTRODUODENOSCOPY (EGD) WITH PROPOFOL;  Surgeon: Dolores Frame, MD;  [facility];  Service: Gastroenterology;  Laterality: N/A;   POLYPECTOMY  04/12/2022   Procedure: POLYPECTOMY;  Surgeon: Dolores Frame, MD;  [facility];  Service: Gastroenterology;;   SHOULDER SURGERY Right    dec 2022   TONSILLECTOMY      Family History: Family History  Problem Relation Age of Onset   Colonic polyp Father    Hypertension Father    Colon polyps Sister     Social History: Social History   Tobacco Use  Smoking Status Never   Passive exposure: Past  Smokeless Tobacco Never   Social History   Substance and Sexual Activity  Alcohol Use Never   Social History  Substance and Sexual Activity  Drug Use Never    Allergies: No Known Allergies  Medications: Current Outpatient Medications  Medication Sig Dispense Refill   acetaminophen (TYLENOL) 325 MG tablet Take 650 mg by mouth every 6 (six) hours as needed.     amphetamine-dextroamphetamine (ADDERALL) 10 MG tablet Take by mouth.     ergocalciferol (VITAMIN D2) 1.25  MG (50000 UT) capsule Take 50,000 Units by mouth once a week. Q friday     hydrOXYzine (ATARAX) 10 MG tablet Take 1 tablet (10 mg total) by mouth at bedtime. 30 tablet 11   meloxicam (MOBIC) 15 MG tablet Take 15 mg by mouth daily as needed for pain.     Multiple Vitamin (MULTIVITAMIN) capsule Take 1 capsule by mouth daily.     pantoprazole (PROTONIX) 40 MG tablet Take 1 tablet (40 mg total) by mouth daily before breakfast. 90 tablet 3   sulfamethoxazole-trimethoprim (BACTRIM DS) 800-160 MG tablet Take 1 tablet by mouth every 12 (twelve) hours. 56 tablet 0   tamsulosin (FLOMAX) 0.4 MG CAPS capsule Take 1 capsule (0.4 mg total) by mouth at bedtime. 90 capsule 3   Testosterone Undecanoate (JATENZO) 237 MG CAPS Take 1 capsule (237 mg total) by mouth 2 (two) times daily. 60 capsule 5   Cyanocobalamin 1000 MCG/ML KIT Inject as directed every 30 (thirty) days. (Patient not taking: Reported on 06/15/2023)     indapamide (LOZOL) 1.25 MG tablet Take 1.25 mg by mouth daily.     No current facility-administered medications for this visit.    Review of Systems: GENERAL: negative for malaise, night sweats HEENT: No changes in hearing or vision, no nose bleeds or other nasal problems. NECK: Negative for lumps, goiter, pain and significant neck swelling RESPIRATORY: Negative for cough, wheezing CARDIOVASCULAR: Negative for chest pain, leg swelling, palpitations, orthopnea GI: SEE HPI MUSCULOSKELETAL: Negative for joint pain or swelling, back pain, and muscle pain. SKIN: Negative for lesions, rash PSYCH: Negative for sleep disturbance, mood disorder and recent psychosocial stressors. HEMATOLOGY Negative for prolonged bleeding, bruising easily, and swollen nodes. ENDOCRINE: Negative for cold or heat intolerance, polyuria, polydipsia and goiter. NEURO: negative for tremor, gait imbalance, syncope and seizures. The remainder of the review of systems is noncontributory.   Physical Exam: BP 103/72 (BP  Location: Left Arm, Patient Position: Sitting, Cuff Size: Large)   Pulse (!) 125   Temp 97.8 F (36.6 C) (Temporal)   Ht 5\' 11"  (1.803 m)   Wt 220 lb 9.6 oz (100.1 kg)   BMI 30.77 kg/m  GENERAL: The patient is AO x3, in no acute distress. HEENT: Head is normocephalic and atraumatic. EOMI are intact. Mouth is well hydrated and without lesions. NECK: Supple. No masses LUNGS: Clear to auscultation. No presence of rhonchi/wheezing/rales. Adequate chest expansion HEART: RRR, normal s1 and s2. ABDOMEN: mildly tender upon palpation on the right side of abdomen, no guarding, no peritoneal signs, and nondistended. BS +. No masses. EXTREMITIES: Without any cyanosis, clubbing, rash, lesions or edema. NEUROLOGIC: AOx3, no focal motor deficit. SKIN: no jaundice, no rashes  Imaging/Labs: as above  I personally reviewed and interpreted the available labs, imaging and endoscopic files.  Impression and Plan: Juan Murphy is a 47 y.o. male with past medical history of GERD, renal calculi, plantar fasciitis, IBS, vitamin D deficiency, B12 deficiency, sleep apnea/cpap,  who presents for follow up of IBS, GERD and dysphagia.  Regarding his GERD, he appears to have relatively well-controlled symptoms with his current dose of pantoprazole.  He should continue these on a daily basis but if his symptoms were to worsen, may need to increase his PPI dosage.  He has presented some recurrent episode of abdominal pain in the setting of significant recent stress.  He likely had been exacerbating his IBS, for which he would benefit from implementing a low FODMAP diet but also taking IBgard and Bentyl as needed.  If he were to have persistent abdominal pain episodes, we may consider proceeding with cross-sectional abdominal imaging.  - Explained presumed etiology of IBS symptoms. Patient was counseled about the benefit of implementing a low FODMAP to improve symptoms and recurrent episodes. A dietary list was  provided to the patient. Also, the patient was counseled about the benefit of avoiding stressing situations and potential environmental triggers leading to symptomatology. - Start Bentyl 1 tablet q12h as needed for abdominal pain - Start IBGard 1 tablet every 8 hours as needed for bloating. Can use peppermint tea as well. - Continue pantoprazole 40 mg qday  All questions were answered.      Katrinka Blazing, MD Gastroenterology and Hepatology [facility] Gastroenterology

## 2023-07-04 ENCOUNTER — Telehealth: Payer: Self-pay | Admitting: Urology

## 2023-07-04 NOTE — Telephone Encounter (Signed)
Patient called he would like to speak to Dr Ronne Binning about switching from the pills to the shots. Please call to advise

## 2023-07-04 NOTE — Telephone Encounter (Signed)
I left a voicemail requesting a call back

## 2023-08-04 ENCOUNTER — Other Ambulatory Visit: Payer: BC Managed Care – PPO

## 2023-08-05 LAB — COMPREHENSIVE METABOLIC PANEL
ALT: 32 [IU]/L (ref 0–44)
AST: 21 [IU]/L (ref 0–40)
Albumin: 4.5 g/dL (ref 4.1–5.1)
Alkaline Phosphatase: 60 [IU]/L (ref 44–121)
BUN/Creatinine Ratio: 10 (ref 9–20)
BUN: 12 mg/dL (ref 6–24)
Bilirubin Total: 0.4 mg/dL (ref 0.0–1.2)
CO2: 21 mmol/L (ref 20–29)
Calcium: 9.5 mg/dL (ref 8.7–10.2)
Chloride: 105 mmol/L (ref 96–106)
Creatinine, Ser: 1.22 mg/dL (ref 0.76–1.27)
Globulin, Total: 2.2 g/dL (ref 1.5–4.5)
Glucose: 92 mg/dL (ref 70–99)
Potassium: 4.3 mmol/L (ref 3.5–5.2)
Sodium: 142 mmol/L (ref 134–144)
Total Protein: 6.7 g/dL (ref 6.0–8.5)
eGFR: 74 mL/min/{1.73_m2} (ref >59)

## 2023-08-05 LAB — CBC
Hematocrit: 49.6 % (ref 37.5–51.0)
Hemoglobin: 16.5 g/dL (ref 13.0–17.7)
MCH: 29.3 pg (ref 26.6–33.0)
MCHC: 33.3 g/dL (ref 31.5–35.7)
MCV: 88 fL (ref 79–97)
Platelets: 358 10*3/uL (ref 150–450)
RBC: 5.63 x10E6/uL (ref 4.14–5.80)
RDW: 13 % (ref 11.6–15.4)
WBC: 5.8 10*3/uL (ref 3.4–10.8)

## 2023-08-05 LAB — TESTOSTERONE,FREE AND TOTAL
Testosterone, Free: 21.8 pg/mL — ABNORMAL HIGH (ref 6.8–21.5)
Testosterone: 593 ng/dL (ref 264–916)

## 2023-08-11 ENCOUNTER — Ambulatory Visit (INDEPENDENT_AMBULATORY_CARE_PROVIDER_SITE_OTHER): Payer: 59 | Admitting: Urology

## 2023-08-11 DIAGNOSIS — R3912 Poor urinary stream: Secondary | ICD-10-CM

## 2023-08-11 DIAGNOSIS — E291 Testicular hypofunction: Secondary | ICD-10-CM

## 2023-08-11 DIAGNOSIS — N200 Calculus of kidney: Secondary | ICD-10-CM | POA: Diagnosis not present

## 2023-08-11 LAB — URINALYSIS, ROUTINE W REFLEX MICROSCOPIC
Bilirubin, UA: NEGATIVE
Glucose, UA: NEGATIVE
Ketones, UA: NEGATIVE
Leukocytes,UA: NEGATIVE
Nitrite, UA: NEGATIVE
Protein,UA: NEGATIVE
RBC, UA: NEGATIVE
Specific Gravity, UA: 1.02 (ref 1.005–1.030)
Urobilinogen, Ur: 0.2 mg/dL (ref 0.2–1.0)
pH, UA: 6.5 (ref 5.0–7.5)

## 2023-08-11 MED ORDER — ALFUZOSIN HCL ER 10 MG PO TB24: 10.0000 mg | ORAL_TABLET | Freq: Every day | ORAL | 11 refills | Status: DC

## 2023-08-11 MED ORDER — TESTOSTERONE CYPIONATE 200 MG/ML IM SOLN: 100.0000 mg | INTRAMUSCULAR | 3 refills | Status: DC

## 2023-08-11 NOTE — Progress Notes (Signed)
 08/11/2023 1:07 PM   Juan Murphy 1975-12-01 [id number]  Referring provider: Lari Elspeth BRAVO, MD [address] [place],  [place] [address]  Followup hypogonadism   HPI: Mr Poynter is a 47yo here for followup for hypogonadism and difficulty urinating. Testosterone  539, hemoglobin 16.5. He is having intermittent right testis pain. IPSS 14 QOL 3 off flomax . He had nasal congestion. Nocturia 3x.    PMH: Past Medical History:  Diagnosis Date   GERD (gastroesophageal reflux disease)    Kidney stone    Plantar fasciitis     Surgical History: Past Surgical History:  Procedure Laterality Date   BIOPSY  04/12/2022   Procedure: BIOPSY;  Surgeon: Eartha Angelia Sieving, MD;  [facility];  Service: Gastroenterology;;   COLONOSCOPY WITH PROPOFOL  N/A 04/12/2022   Procedure: COLONOSCOPY WITH PROPOFOL ;  Surgeon: Eartha Angelia Sieving, MD;  [facility];  Service: Gastroenterology;  Laterality: N/A;  150 ASA 2   CYSTOSCOPY     ESOPHAGOGASTRODUODENOSCOPY (EGD) WITH PROPOFOL  N/A 04/12/2022   Procedure: ESOPHAGOGASTRODUODENOSCOPY (EGD) WITH PROPOFOL ;  Surgeon: Eartha Angelia Sieving, MD;  [facility];  Service: Gastroenterology;  Laterality: N/A;   POLYPECTOMY  04/12/2022   Procedure: POLYPECTOMY;  Surgeon: Eartha Angelia Sieving, MD;  [facility];  Service: Gastroenterology;;   SHOULDER SURGERY Right    dec 2022   TONSILLECTOMY      Home Medications:  Allergies as of 08/11/2023   No Known Allergies      Medication List        Accurate as of August 11, 2023  1:07 PM. If you have any questions, ask your nurse or doctor.          acetaminophen 325 MG tablet Commonly known as: TYLENOL Take 650 mg by mouth every 6 (six) hours as needed.   amphetamine-dextroamphetamine 10 MG tablet Commonly known as: ADDERALL Take by mouth.   Cyanocobalamin 1000 MCG/ML Kit Inject as directed every 30 (thirty) days.   dicyclomine  10 MG  capsule Commonly known as: BENTYL  Take 1 capsule (10 mg total) by mouth every 12 (twelve) hours as needed.   ergocalciferol 1.25 MG (50000 UT) capsule Commonly known as: VITAMIN D2 Take 50,000 Units by mouth once a week. Q friday   hydrOXYzine  10 MG tablet Commonly known as: ATARAX  Take 1 tablet (10 mg total) by mouth at bedtime.   indapamide 1.25 MG tablet Commonly known as: LOZOL Take 1.25 mg by mouth daily.   Jatenzo  237 MG Caps Generic drug: Testosterone  Undecanoate Take 1 capsule (237 mg total) by mouth 2 (two) times daily.   meloxicam 15 MG tablet Commonly known as: MOBIC Take 15 mg by mouth daily as needed for pain.   multivitamin capsule Take 1 capsule by mouth daily.   pantoprazole  40 MG tablet Commonly known as: PROTONIX  Take 1 tablet (40 mg total) by mouth daily before breakfast.   sulfamethoxazole -trimethoprim  800-160 MG tablet Commonly known as: BACTRIM  DS Take 1 tablet by mouth every 12 (twelve) hours.   tamsulosin  0.4 MG Caps capsule Commonly known as: FLOMAX  Take 1 capsule (0.4 mg total) by mouth at bedtime.        Allergies: No Known Allergies  Family History: Family History  Problem Relation Age of Onset   Colonic polyp Father    Hypertension Father    Colon polyps Sister     Social History:  reports that he has never smoked. He has been exposed to tobacco smoke. He has never  used smokeless tobacco. He reports that he does not drink alcohol and does not use drugs.  ROS: All other review of systems were reviewed and are negative except what is noted above in HPI  Physical Exam: There were no vitals taken for this visit.  Constitutional:  Alert and oriented, No acute distress. HEENT: [place] AT, moist mucus membranes.  Trachea midline, no masses. Cardiovascular: No clubbing, cyanosis, or edema. Respiratory: Normal respiratory effort, no increased work of breathing. GI: Abdomen is soft, nontender, nondistended, no abdominal masses GU: No CVA  tenderness.  Lymph: No cervical or inguinal lymphadenopathy. Skin: No rashes, bruises or suspicious lesions. Neurologic: Grossly intact, no focal deficits, moving all 4 extremities. Psychiatric: Normal mood and affect.  Laboratory Data: Lab Results  Component Value Date   WBC 5.8 08/04/2023   HGB 16.5 08/04/2023   HCT 49.6 08/04/2023   MCV 88 08/04/2023   PLT 358 08/04/2023    Lab Results  Component Value Date   CREATININE 1.22 08/04/2023    No results found for: PSA  Lab Results  Component Value Date   TESTOSTERONE  593 08/04/2023    No results found for: HGBA1C  Urinalysis    Component Value Date/Time   APPEARANCEUR Clear 05/03/2023 1554   GLUCOSEU Negative 05/03/2023 1554   BILIRUBINUR Negative 05/03/2023 1554   PROTEINUR Negative 05/03/2023 1554   NITRITE Negative 05/03/2023 1554   LEUKOCYTESUR Negative 05/03/2023 1554    Lab Results  Component Value Date   LABMICR Comment 05/03/2023   WBCUA 0-5 11/05/2020   LABEPIT None seen 11/05/2020   BACTERIA None seen 11/05/2020    Pertinent Imaging:  Results for orders placed in visit on 03/08/22  Abdomen 1 view (KUB)  Narrative CLINICAL DATA:  History of kidney stones  EXAM: ABDOMEN - 1 VIEW  COMPARISON:  CT abdomen and pelvis 08/20/2020  FINDINGS: The bowel gas pattern is normal. No radio-opaque calculi or other significant radiographic abnormality are seen. The 7 mm left renal calculus on CT 08/20/2020 is not confidently visualized.  IMPRESSION: Negative.   Electronically Signed By: Norman Gatlin M.D. On: 03/08/2022 18:49  No results found for this or any previous visit.  No results found for this or any previous visit.  No results found for this or any previous visit.  No results found for this or any previous visit.  No results found for this or any previous visit.  No results found for this or any previous visit.  Results for orders placed in visit on 04/06/22  CT RENAL  STONE STUDY  Narrative CLINICAL DATA:  Left flank pain.  Nephrolithiasis.  EXAM: CT ABDOMEN AND PELVIS WITHOUT CONTRAST  TECHNIQUE: Multidetector CT imaging of the abdomen and pelvis was performed following the standard protocol without IV contrast.  RADIATION DOSE REDUCTION: This exam was performed according to the departmental dose-optimization program which includes automated exposure control, adjustment of the mA and/or kV according to patient size and/or use of iterative reconstruction technique.  COMPARISON:  08/20/2020  FINDINGS: Lower chest: No acute findings.  Hepatobiliary: No mass visualized on this unenhanced exam. Gallbladder is unremarkable. No evidence of biliary ductal dilatation.  Pancreas: No mass or inflammatory process visualized on this unenhanced exam.  Spleen:  Within normal limits in size.  Adrenals/Urinary tract: A few tiny 1-2 mm nonobstructing calculi are again seen in the lower pole of left kidney. No evidence of ureteral calculi or hydronephrosis. Unremarkable unopacified urinary bladder.  Stomach/Bowel: No evidence of obstruction, inflammatory process, or abnormal  fluid collections. Normal appendix visualized.  Vascular/Lymphatic: No pathologically enlarged lymph nodes identified. No evidence of abdominal aortic aneurysm.  Reproductive:  No mass or other significant abnormality.  Other:  None.  Musculoskeletal:  No suspicious bone lesions identified.  IMPRESSION: Tiny nonobstructing left renal calculi. No evidence of ureteral calculi, hydronephrosis, or other acute findings.   Electronically Signed By: Norleen DELENA Kil M.D. On: 04/15/2022 15:57   Assessment & Plan:    1. Hypogonadism male (Primary) IM testosterone  100mg  every week Followup 3 months with testosterone  labs  2. Weak urinary stream Uroxatral  10mg  at bedtime  3. Nephrolithiasis CT stone study   No follow-ups on file.  Belvie Clara, MD  [facility]  Urology [place]

## 2023-08-16 ENCOUNTER — Ambulatory Visit (HOSPITAL_COMMUNITY)
Admission: RE | Admit: 2023-08-16 | Discharge: 2023-08-16 | Disposition: A | Discharge status: Home / Self Care | Payer: 59 | Source: Ambulatory Visit | Attending: Urology | Admitting: Urology

## 2023-08-16 DIAGNOSIS — N200 Calculus of kidney: Secondary | ICD-10-CM | POA: Insufficient documentation

## 2023-08-20 ENCOUNTER — Encounter: Payer: Self-pay | Admitting: Urology

## 2023-08-20 NOTE — Patient Instructions (Signed)

## 2023-09-13 ENCOUNTER — Telehealth: Payer: Self-pay | Admitting: Urology

## 2023-09-13 NOTE — Telephone Encounter (Signed)
 Patient called would like to discuss CT results, he does not have access to his Mychart at this time, will show pt next time he is in office how to access Mychart.

## 2023-09-13 NOTE — Telephone Encounter (Signed)
 Please review imaging per patient request.

## 2023-09-26 NOTE — Telephone Encounter (Signed)
 Notified via mychart.

## 2023-10-30 ENCOUNTER — Other Ambulatory Visit: Payer: Self-pay

## 2023-10-30 DIAGNOSIS — E291 Testicular hypofunction: Secondary | ICD-10-CM

## 2023-11-03 ENCOUNTER — Other Ambulatory Visit

## 2023-11-07 LAB — COMPREHENSIVE METABOLIC PANEL WITH GFR
ALT: 17 IU/L (ref 0–44)
AST: 18 IU/L (ref 0–40)
Albumin: 4.3 g/dL (ref 4.1–5.1)
Alkaline Phosphatase: 64 IU/L (ref 44–121)
BUN/Creatinine Ratio: 9 (ref 9–20)
BUN: 11 mg/dL (ref 6–24)
Bilirubin Total: 0.6 mg/dL (ref 0.0–1.2)
CO2: 25 mmol/L (ref 20–29)
Calcium: 9.3 mg/dL (ref 8.7–10.2)
Chloride: 99 mmol/L (ref 96–106)
Creatinine, Ser: 1.17 mg/dL (ref 0.76–1.27)
Globulin, Total: 2.3 g/dL (ref 1.5–4.5)
Glucose: 76 mg/dL (ref 70–99)
Potassium: 4.4 mmol/L (ref 3.5–5.2)
Sodium: 138 mmol/L (ref 134–144)
Total Protein: 6.6 g/dL (ref 6.0–8.5)
eGFR: 77 mL/min/{1.73_m2} (ref >59)

## 2023-11-07 LAB — CBC
Hematocrit: 48.8 % (ref 37.5–51.0)
Hemoglobin: 16.3 g/dL (ref 13.0–17.7)
MCH: 29.1 pg (ref 26.6–33.0)
MCHC: 33.4 g/dL (ref 31.5–35.7)
MCV: 87 fL (ref 79–97)
Platelets: 339 10*3/uL (ref 150–450)
RBC: 5.61 x10E6/uL (ref 4.14–5.80)
RDW: 13.4 % (ref 11.6–15.4)
WBC: 6.6 10*3/uL (ref 3.4–10.8)

## 2023-11-10 ENCOUNTER — Ambulatory Visit (INDEPENDENT_AMBULATORY_CARE_PROVIDER_SITE_OTHER): Admitting: Urology

## 2023-11-10 VITALS — BP 126/67 | HR 109

## 2023-11-10 DIAGNOSIS — R3912 Poor urinary stream: Secondary | ICD-10-CM | POA: Diagnosis not present

## 2023-11-10 DIAGNOSIS — E291 Testicular hypofunction: Secondary | ICD-10-CM

## 2023-11-10 DIAGNOSIS — N5201 Erectile dysfunction due to arterial insufficiency: Secondary | ICD-10-CM

## 2023-11-10 MED ORDER — TADALAFIL 20 MG PO TABS: 20.0000 mg | ORAL_TABLET | ORAL | 5 refills | Status: DC | PRN

## 2023-11-10 MED ORDER — TADALAFIL 5 MG PO TABS: 5.0000 mg | ORAL_TABLET | Freq: Every day | ORAL | 11 refills | Status: DC

## 2023-11-10 MED ORDER — TESTOSTERONE CYPIONATE 200 MG/ML IM SOLN: 100.0000 mg | INTRAMUSCULAR | 3 refills | Status: DC

## 2023-11-10 NOTE — Progress Notes (Signed)
 11/10/2023 11:15 AM   Mindy Alu April 13, 1976 [id number]  Referring provider: Alston Jerry, MD [address] [place],  [place] [address]  Followup hypogonadism   HPI: Mr Schue is a 47yo here for followup for hypogonadism and weak urinary stream. He uses IM testosterone  100mg  every week. He remains fatigue at times but overall the energy is better. Hemoglobin 16.3. CMP normal. IPSS 12 QOL 3 and takes uroxatral  10mg  intermittently. Since last visit he has noted issues getting and maintaining an erection. He has tried tadalafil  intermittent and sildenafil with mixed results   PMH: Past Medical History:  Diagnosis Date   GERD (gastroesophageal reflux disease)    Kidney stone    Plantar fasciitis     Surgical History: Past Surgical History:  Procedure Laterality Date   BIOPSY  04/12/2022   Procedure: BIOPSY;  Surgeon: Urban Garden, MD;  [facility];  Service: Gastroenterology;;   COLONOSCOPY WITH PROPOFOL  N/A 04/12/2022   Procedure: COLONOSCOPY WITH PROPOFOL ;  Surgeon: Urban Garden, MD;  [facility];  Service: Gastroenterology;  Laterality: N/A;  150 ASA 2   CYSTOSCOPY     ESOPHAGOGASTRODUODENOSCOPY (EGD) WITH PROPOFOL  N/A 04/12/2022   Procedure: ESOPHAGOGASTRODUODENOSCOPY (EGD) WITH PROPOFOL ;  Surgeon: Urban Garden, MD;  [facility];  Service: Gastroenterology;  Laterality: N/A;   POLYPECTOMY  04/12/2022   Procedure: POLYPECTOMY;  Surgeon: Urban Garden, MD;  [facility];  Service: Gastroenterology;;   SHOULDER SURGERY Right    dec 2022   TONSILLECTOMY      Home Medications:  Allergies as of 11/10/2023   No Known Allergies      Medication List        Accurate as of November 10, 2023 11:15 AM. If you have any questions, ask your nurse or doctor.          acetaminophen 325 MG tablet Commonly known as: TYLENOL Take 650 mg by mouth every 6 (six) hours as needed.    alfuzosin  10 MG 24 hr tablet Commonly known as: UROXATRAL  Take 1 tablet (10 mg total) by mouth at bedtime.   amphetamine-dextroamphetamine 10 MG tablet Commonly known as: ADDERALL Take by mouth.   Cyanocobalamin 1000 MCG/ML Kit Inject as directed every 30 (thirty) days.   dicyclomine  10 MG capsule Commonly known as: BENTYL  Take 1 capsule (10 mg total) by mouth every 12 (twelve) hours as needed.   ergocalciferol 1.25 MG (50000 UT) capsule Commonly known as: VITAMIN D2 Take 50,000 Units by mouth once a week. Q friday   hydrOXYzine  10 MG tablet Commonly known as: ATARAX  Take 1 tablet (10 mg total) by mouth at bedtime.   indapamide 1.25 MG tablet Commonly known as: LOZOL Take 1.25 mg by mouth daily.   Jatenzo  237 MG Caps Generic drug: Testosterone  Undecanoate Take 1 capsule (237 mg total) by mouth 2 (two) times daily.   meloxicam 15 MG tablet Commonly known as: MOBIC Take 15 mg by mouth daily as needed for pain.   multivitamin capsule Take 1 capsule by mouth daily.   pantoprazole  40 MG tablet Commonly known as: PROTONIX  Take 1 tablet (40 mg total) by mouth daily before breakfast.   sulfamethoxazole -trimethoprim  800-160 MG tablet Commonly known as: BACTRIM  DS Take 1 tablet by mouth every 12 (twelve) hours.   tamsulosin  0.4 MG Caps capsule Commonly known as: FLOMAX  Take 1 capsule (0.4 mg total) by mouth at bedtime.   testosterone  cypionate 200 MG/ML injection Commonly known as:  DEPOTESTOSTERONE CYPIONATE Inject 0.5 mLs (100 mg total) into the muscle every 7 (seven) days.        Allergies: No Known Allergies  Family History: Family History  Problem Relation Age of Onset   Colonic polyp Father    Hypertension Father    Colon polyps Sister     Social History:  reports that he has never smoked. He has been exposed to tobacco smoke. He has never used smokeless tobacco. He reports that he does not drink alcohol and does not use drugs.  ROS: All other  review of systems were reviewed and are negative except what is noted above in HPI  Physical Exam: BP 126/67   Pulse (!) 109   Constitutional:  Alert and oriented, No acute distress. HEENT: [place] AT, moist mucus membranes.  Trachea midline, no masses. Cardiovascular: No clubbing, cyanosis, or edema. Respiratory: Normal respiratory effort, no increased work of breathing. GI: Abdomen is soft, nontender, nondistended, no abdominal masses GU: No CVA tenderness.  Lymph: No cervical or inguinal lymphadenopathy. Skin: No rashes, bruises or suspicious lesions. Neurologic: Grossly intact, no focal deficits, moving all 4 extremities. Psychiatric: Normal mood and affect.  Laboratory Data: Lab Results  Component Value Date   WBC 6.6 11/06/2023   HGB 16.3 11/06/2023   HCT 48.8 11/06/2023   MCV 87 11/06/2023   PLT 339 11/06/2023    Lab Results  Component Value Date   CREATININE 1.17 11/06/2023    No results found for: "PSA"  Lab Results  Component Value Date   TESTOSTERONE  593 08/04/2023    No results found for: "HGBA1C"  Urinalysis    Component Value Date/Time   APPEARANCEUR Clear 08/11/2023 1316   GLUCOSEU Negative 08/11/2023 1316   BILIRUBINUR Negative 08/11/2023 1316   PROTEINUR Negative 08/11/2023 1316   NITRITE Negative 08/11/2023 1316   LEUKOCYTESUR Negative 08/11/2023 1316    Lab Results  Component Value Date   LABMICR Comment 08/11/2023   WBCUA 0-5 11/05/2020   LABEPIT None seen 11/05/2020   BACTERIA None seen 11/05/2020    Pertinent Imaging:  Results for orders placed in visit on 03/08/22  Abdomen 1 view (KUB)  Narrative CLINICAL DATA:  History of kidney stones  EXAM: ABDOMEN - 1 VIEW  COMPARISON:  CT abdomen and pelvis 08/20/2020  FINDINGS: The bowel gas pattern is normal. No radio-opaque calculi or other significant radiographic abnormality are seen. The 7 mm left renal calculus on CT 08/20/2020 is not confidently  visualized.  IMPRESSION: Negative.   Electronically Signed By: Rozell Cornet M.D. On: 03/08/2022 18:49  No results found for this or any previous visit.  No results found for this or any previous visit.  No results found for this or any previous visit.  No results found for this or any previous visit.  No results found for this or any previous visit.  No results found for this or any previous visit.  Results for orders placed in visit on 08/11/23  CT RENAL STONE STUDY  Narrative CLINICAL DATA:  Kidney stones, symptomatic.  EXAM: CT ABDOMEN AND PELVIS WITHOUT CONTRAST  TECHNIQUE: Multidetector CT imaging of the abdomen and pelvis was performed following the standard protocol without IV contrast.  RADIATION DOSE REDUCTION: This exam was performed according to the departmental dose-optimization program which includes automated exposure control, adjustment of the mA and/or kV according to patient size and/or use of iterative reconstruction technique.  COMPARISON:  04/14/2022  FINDINGS: Lower chest: No acute abnormality.  Hepatobiliary: No focal liver  abnormality is seen. No gallstones, gallbladder wall thickening, or biliary dilatation.  Pancreas: Unremarkable. No pancreatic ductal dilatation or surrounding inflammatory changes.  Spleen: Normal in size without focal abnormality.  Adrenals/Urinary Tract: Normal adrenal glands. No right kidney stones or hydronephrosis. There are 2 stones within the lower pole collecting system of the left kidney which measure up to 3 mm, image 62/5 and image 66/5. No hydronephrosis identified. No kidney mass. No hydroureter or ureteral calculi, bilaterally. Urinary bladder appears partially decompressed. No bladder calculi noted.  Stomach/Bowel: Stomach is normal. The appendix is visualized and appears normal. No pathologic dilatation of the large or small bowel loops. Scattered colonic diverticula noted within the sigmoid  colon. No acute diverticulitis.  Vascular/Lymphatic: No significant vascular findings are present. No enlarged abdominal or pelvic lymph nodes.  Reproductive: No mass identified.  Other: No free fluid or fluid collections. No signs of pneumoperitoneum.  Musculoskeletal: No acute or significant osseous findings.  IMPRESSION: 1. No acute findings within the abdomen or pelvis. 2. Nonobstructing left renal calculi. 3. Sigmoid diverticulosis without evidence for acute diverticulitis.   Electronically Signed By: Kimberley Penman M.D. On: 08/27/2023 16:00   Assessment & Plan:    1. Hypogonadism male (Primary) Continue Im testosterone  100mg  weekly. Followup 6 months with testosterone  labs  2. Weak urinary stream -tadalafil  5mg  daily  3. Erectile dysfunction -tadalafil  20mg  prn   No follow-ups on file.  Johnie Nailer, MD  [facility] [place]

## 2023-11-11 LAB — TESTOSTERONE,FREE AND TOTAL
Testosterone, Free: 42 pg/mL — ABNORMAL HIGH (ref 6.8–21.5)
Testosterone: 1267 ng/dL — ABNORMAL HIGH (ref 264–916)

## 2023-11-26 ENCOUNTER — Encounter: Payer: Self-pay | Admitting: Urology

## 2023-11-26 NOTE — Patient Instructions (Signed)

## 2023-12-14 ENCOUNTER — Ambulatory Visit (INDEPENDENT_AMBULATORY_CARE_PROVIDER_SITE_OTHER): Payer: BC Managed Care – PPO | Admitting: Gastroenterology

## 2023-12-14 VITALS — BP 129/83 | HR 93 | Ht 71.0 in | Wt 191.9 lb

## 2023-12-14 DIAGNOSIS — R142 Eructation: Secondary | ICD-10-CM | POA: Diagnosis not present

## 2023-12-14 DIAGNOSIS — K219 Gastro-esophageal reflux disease without esophagitis: Secondary | ICD-10-CM

## 2023-12-14 DIAGNOSIS — K582 Mixed irritable bowel syndrome: Secondary | ICD-10-CM

## 2023-12-14 DIAGNOSIS — K589 Irritable bowel syndrome without diarrhea: Secondary | ICD-10-CM | POA: Diagnosis not present

## 2023-12-14 MED ORDER — PANTOPRAZOLE SODIUM 40 MG PO TBEC: 40.0000 mg | DELAYED_RELEASE_TABLET | Freq: Every day | ORAL | 3 refills | Status: AC

## 2023-12-14 NOTE — Patient Instructions (Addendum)
 We will continue protonix  40mg  daily As discussed, zepbound can cause a lot of GI upset, I suspect belching is coming from this. Try to be mindful of starchy foods and refined/non complex carbs as these can worsen GI issues on these medications  You can try over the counter phazyme or gas x to help For the spasms, you can try over the counter IBgard or use your dicyclomine  10mg  for this  Let me know if symptoms are not improving or are worsening  Follow up 6 months  It was a pleasure to see you today! I want to create trusting relationships with patients and provide genuine, compassionate, and quality care. I truly value your feedback! please be on the lookout for a survey regarding your visit with me today. I appreciate your input about our visit and your time in completing this!    Juan Murphy L. Juan Monteforte, MSN, APRN, AGNP-C Adult-Gerontology Nurse Practitioner [facility] Gastroenterology at [facility]

## 2023-12-14 NOTE — Progress Notes (Addendum)
 Referring Provider: Alston Jerry, MD Primary Care Physician:  Alston Jerry, MD Primary GI Physician: Dr. Sammi Crick   Chief Complaint  Patient presents with   Gastroesophageal Reflux    Follow up on GERD. Has some concerns about belching.    HPI:   Juan Murphy is a 48 y.o. male with past medical history of  GERD, renal calculi, plantar fasciitis, IBS, vitamin D deficiency, B12 deficiency, sleep apnea/cpap   Patient presenting today for:  Follow up of IBS and GERD  Last seen November 2024, at that time having some intermittent RUQ pain, spasms in lower abdomen. BMs up to 4 times per day, more flatulence and bloating. Endorses more stress. No heartburn with protonix . Mild swallowing issues with crackers/breads.   Recommended low FODMAP, bentyl  q12h, IBgard, continue protonix  40mg  daily.   Present: He is on zepbound. Having more belching. He takes the shot on Monday and will have more symptoms later in the week thereafter. He is taking fiber for his bowels, having less BMs taking zepbound but no constipation. Feels GERD is well controlled on protonix . Trying to eat healthier and going to the gym. He has lost about 29 pounds since starting zepbound. Does not think he tried IBgard. He is taking meloxican daily. He is unsure if he is taking dicyclomine  or not. Taking tums or pepto for belching which seems to help symptoms.    No red flag symptoms. Patient denies melena, hematochezia, nausea, vomiting, diarrhea, constipation, dysphagia, odyonophagia, early satiety or weight loss.    Last Colonoscopy:- 04/12/2022 Three 4 to 8 mm polyps in the transverse colon and in the ascending colon, sessile polyps  - Non-bleeding internal hemorrhoids. Last Endoscopy:- 04/12/2022 No endoscopic esophageal abnormality to explain patient's dysphagia. Esophagus dilated.Dilated. Biopsied-reflux changes - LA Grade A reflux esophagitis with no bleeding. - A few fundic gland polyps. - Normal  examined duodenum. Biopsied-normal    Recommendations:  Repeat TCS 3 years Filed Weights   12/14/23 0828  Weight: 191 lb 14.4 oz (87 kg)     Past Medical History:  Diagnosis Date   GERD (gastroesophageal reflux disease)    Kidney stone    Plantar fasciitis     Past Surgical History:  Procedure Laterality Date   BIOPSY  04/12/2022   Procedure: BIOPSY;  Surgeon: Urban Garden, MD;  [facility];  Service: Gastroenterology;;   COLONOSCOPY WITH PROPOFOL  N/A 04/12/2022   Procedure: COLONOSCOPY WITH PROPOFOL ;  Surgeon: Urban Garden, MD;  [facility];  Service: Gastroenterology;  Laterality: N/A;  150 ASA 2   CYSTOSCOPY     ESOPHAGOGASTRODUODENOSCOPY (EGD) WITH PROPOFOL  N/A 04/12/2022   Procedure: ESOPHAGOGASTRODUODENOSCOPY (EGD) WITH PROPOFOL ;  Surgeon: Urban Garden, MD;  [facility];  Service: Gastroenterology;  Laterality: N/A;   POLYPECTOMY  04/12/2022   Procedure: POLYPECTOMY;  Surgeon: Umberto Ganong, Bearl Limes, MD;  [facility];  Service: Gastroenterology;;   SHOULDER SURGERY Right    dec 2022   TONSILLECTOMY      Current Outpatient Medications  Medication Sig Dispense Refill   acetaminophen (TYLENOL) 325 MG tablet Take 650 mg by mouth every 6 (six) hours as needed.     amphetamine-dextroamphetamine (ADDERALL) 10 MG tablet Take by mouth.     Cyanocobalamin 1000 MCG/ML KIT Inject as directed every 30 (thirty) days.     dicyclomine  (BENTYL ) 10 MG capsule Take 1 capsule (10 mg total) by mouth every 12 (twelve) hours as needed. 180 capsule 1  ergocalciferol (VITAMIN D2) 1.25 MG (50000 UT) capsule Take 50,000 Units by mouth once a week. Q friday     hydrOXYzine  (ATARAX ) 10 MG tablet Take 1 tablet (10 mg total) by mouth at bedtime. 30 tablet 11   meloxicam (MOBIC) 15 MG tablet Take 15 mg by mouth daily as needed for pain.     Multiple Vitamin (MULTIVITAMIN) capsule Take 1 capsule by mouth daily.      pantoprazole  (PROTONIX ) 40 MG tablet Take 1 tablet (40 mg total) by mouth daily before breakfast. 90 tablet 3   sulfamethoxazole -trimethoprim  (BACTRIM  DS) 800-160 MG tablet Take 1 tablet by mouth every 12 (twelve) hours. 56 tablet 0   tamsulosin  (FLOMAX ) 0.4 MG CAPS capsule Take 1 capsule (0.4 mg total) by mouth at bedtime. 90 capsule 3   testosterone  cypionate (DEPOTESTOSTERONE CYPIONATE) 200 MG/ML injection Inject 0.5 mLs (100 mg total) into the muscle every 7 (seven) days. 10 mL 3   ZEPBOUND 2.5 MG/0.5ML Pen Inject 2.5 mg into the skin once a week.     alfuzosin  (UROXATRAL ) 10 MG 24 hr tablet Take 1 tablet (10 mg total) by mouth at bedtime. 30 tablet 11   indapamide (LOZOL) 1.25 MG tablet Take 1.25 mg by mouth daily. (Patient not taking: Reported on 12/14/2023)     tadalafil  (CIALIS ) 20 MG tablet Take 1 tablet (20 mg total) by mouth as needed. 30 tablet 5   tadalafil  (CIALIS ) 5 MG tablet Take 1 tablet (5 mg total) by mouth daily. 30 tablet 11   No current facility-administered medications for this visit.    Allergies as of 12/14/2023   (No Known Allergies)    Social History   Socioeconomic History   Marital status: Divorced    Spouse name: Not on file   Number of children: Not on file   Years of education: Not on file   Highest education level: Not on file  Occupational History   Not on file  Tobacco Use   Smoking status: Never    Passive exposure: Past   Smokeless tobacco: Never  Vaping Use   Vaping status: Never Used  Substance and Sexual Activity   Alcohol use: Never   Drug use: Never   Sexual activity: Yes  Other Topics Concern   Not on file  Social History Narrative   Not on file   Social Drivers of Health   Financial Resource Strain: Low Risk  (11/14/2022)   Received from [facility], [facility]   Overall Financial Resource Strain (CARDIA)    Difficulty of Paying Living Expenses: Not hard at all  Food Insecurity: No Food Insecurity (11/14/2022)   Received from  [facility], [facility]   Hunger Vital Sign    Worried About Running Out of Food in the Last Year: Never true    Ran Out of Food in the Last Year: Never true  Transportation Needs: No Transportation Needs (11/14/2022)   [facility], [facility]   PRAPARE - Transportation    Lack of Transportation (Medical): No    Lack of Transportation (Non-Medical): No  Physical Activity: Not on file  Stress: Not on file  Social Connections: Unknown (12/06/2021)   Received from [facility], [facility]   Social Network    Social Network: Not on file    Review of systems General: negative for malaise, night sweats, fever, chills, weight loss Neck: Negative for lumps, goiter, pain and significant neck swelling Resp: Negative for cough, wheezing, dyspnea at rest CV: Negative  for chest pain, leg swelling, palpitations, orthopnea GI: denies melena, hematochezia, nausea, vomiting, diarrhea, constipation, dysphagia, odyonophagia, early satiety or unintentional weight loss. +belching  The remainder of the review of systems is noncontributory.  Physical Exam: BP 129/83   Pulse 93   Ht 5\' 11"  (1.803 m)   Wt 191 lb 14.4 oz (87 kg)   BMI 26.76 kg/m  General:   Alert and oriented. No distress noted. Pleasant and cooperative.  Head:  Normocephalic and atraumatic. Eyes:  Conjuctiva clear without scleral icterus. Mouth:  Oral mucosa pink and moist. Good dentition. No lesions. Heart: Normal rate and rhythm, s1 and s2 heart sounds present.  Lungs: Clear lung sounds in all lobes. Respirations equal and unlabored. Abdomen:  +BS, soft, non-tender and non-distended. No rebound or guarding. No HSM or masses noted. Neurologic:  Alert and  oriented x4 Psych:  Alert and cooperative. Normal mood and affect.  Invalid input(s): "6 MONTHS"   ASSESSMENT: MARKHI KLECKNER is a 48 y.o. male presenting today for follow up of IBS and GERD  IBS: Taking fiber which seems to help with his bowel  movements though having less frequent bowel movements onset melena.  Denies any constipation, rectal bleeding, melena.  He has some continued abdominal spasms, does not be tried IBgard last visit.  Unsure if he is taking dicyclomine .  Recommended he try either IBgard or dicyclomine  for his abdominal spasms, continue with fiber, good water intake.  GERD: No heartburn or indigestion on Protonix  40 mg daily.  Notes more belching since starting Zepbound injection.  Suspect belching may be secondary to GLP-1 as this is a known side effect.  Discussed lower intake of starchy foods and refined carbohydrates as this can worsen GI symptoms on GLP-1.  Recommend he try over-the-counter Phazyme or Gas-X which may help.  If he continues to have belching or feels that symptoms are worsening we can consider further evaluation with SIBO testing.   PLAN:  -be mindful of starchy foods/refined carbs -continue protonix  40mg  daily -phazyme or gas x for belching, consider further eval (SIBO testing) if persisting -can try IBgard or take bentyl  for abdominal spasms  All questions were answered, patient verbalized understanding and is in agreement with plan as outlined above.   Follow Up: 6 months   Jacinta Penalver L. Adrien Alberta, MSN, APRN, AGNP-C Adult-Gerontology Nurse Practitioner [facility]  I have reviewed the note and agree with the APP's assessment as described in this progress note  Samantha Cress, MD Gastroenterology and Hepatology [facility]enter[facility]

## 2024-01-12 ENCOUNTER — Telehealth: Payer: Self-pay

## 2024-01-12 ENCOUNTER — Telehealth: Payer: Self-pay | Admitting: Urology

## 2024-01-12 NOTE — Telephone Encounter (Signed)
 Patient did a prepay vacation and has not been able to take the vacation and the company is saying it has expired. He just needs a note saying he has been under Dr Claretta Croft care since August 2023.

## 2024-01-16 NOTE — Telephone Encounter (Signed)
 Patient is made aware note has been sent through mychart. Voiced understanding.

## 2024-02-19 ENCOUNTER — Other Ambulatory Visit: Payer: Self-pay

## 2024-02-19 ENCOUNTER — Telehealth: Payer: Self-pay | Admitting: Urology

## 2024-02-19 DIAGNOSIS — E291 Testicular hypofunction: Secondary | ICD-10-CM

## 2024-02-19 NOTE — Telephone Encounter (Signed)
 Patient wants to know 2 things :  Will the testosterone  make him sterile?  2. He has been taking it wrong he has been taking the whole vial of medication instead of half , his endocrinologist has corrected him and showed him the proper way to read syringe , will this effect anything?

## 2024-02-19 NOTE — Telephone Encounter (Signed)
 Returned pt phone call to advise pt per verbal from MD McKenzie 1. Too much testosterone  can cause sterilization MD also wanted to ask pt when was the last time pt injected pt stated this past Saturday MD also wanted to know how muvh he had been injecting pt stated 1 full mL and he has been doing it since 11/10/2023 MD also wanted pt to come in for testosterone  labs pt scheduled for labs and voiced his understanding

## 2024-02-20 ENCOUNTER — Other Ambulatory Visit

## 2024-02-20 DIAGNOSIS — E291 Testicular hypofunction: Secondary | ICD-10-CM

## 2024-02-21 LAB — COMPREHENSIVE METABOLIC PANEL WITH GFR
ALT: 17 IU/L (ref 0–44)
AST: 21 IU/L (ref 0–40)
Albumin: 4.4 g/dL (ref 4.1–5.1)
Alkaline Phosphatase: 55 IU/L (ref 44–121)
BUN/Creatinine Ratio: 14 (ref 9–20)
BUN: 17 mg/dL (ref 6–24)
Bilirubin Total: 0.5 mg/dL (ref 0.0–1.2)
CO2: 19 mmol/L — ABNORMAL LOW (ref 20–29)
Calcium: 9.8 mg/dL (ref 8.7–10.2)
Chloride: 101 mmol/L (ref 96–106)
Creatinine, Ser: 1.25 mg/dL (ref 0.76–1.27)
Globulin, Total: 2.5 g/dL (ref 1.5–4.5)
Glucose: 116 mg/dL — ABNORMAL HIGH (ref 70–99)
Potassium: 4.6 mmol/L (ref 3.5–5.2)
Sodium: 137 mmol/L (ref 134–144)
Total Protein: 6.9 g/dL (ref 6.0–8.5)
eGFR: 71 mL/min/1.73 (ref >59)

## 2024-02-21 LAB — CBC
Hematocrit: 52 % — ABNORMAL HIGH (ref 37.5–51.0)
Hemoglobin: 16.9 g/dL (ref 13.0–17.7)
MCH: 29.9 pg (ref 26.6–33.0)
MCHC: 32.5 g/dL (ref 31.5–35.7)
MCV: 92 fL (ref 79–97)
Platelets: 386 x10E3/uL (ref 150–450)
RBC: 5.65 x10E6/uL (ref 4.14–5.80)
RDW: 14.3 % (ref 11.6–15.4)
WBC: 11.6 x10E3/uL — ABNORMAL HIGH (ref 3.4–10.8)

## 2024-02-21 LAB — TESTOSTERONE: Testosterone: 885 ng/dL (ref 264–916)

## 2024-02-27 ENCOUNTER — Ambulatory Visit: Payer: Self-pay | Admitting: Urology

## 2024-02-27 ENCOUNTER — Encounter (INDEPENDENT_AMBULATORY_CARE_PROVIDER_SITE_OTHER): Payer: Self-pay

## 2024-05-04 ENCOUNTER — Other Ambulatory Visit: Payer: Self-pay | Admitting: Urology

## 2024-05-13 ENCOUNTER — Other Ambulatory Visit

## 2024-05-20 ENCOUNTER — Ambulatory Visit: Admitting: Urology

## 2024-05-20 ENCOUNTER — Encounter (INDEPENDENT_AMBULATORY_CARE_PROVIDER_SITE_OTHER): Payer: Self-pay | Admitting: Gastroenterology

## 2024-05-21 ENCOUNTER — Telehealth: Payer: Self-pay

## 2024-05-21 NOTE — Telephone Encounter (Signed)
 We received a referral via fax from patients pcp. I called patient and left a vm advising him to contact the office to schedule an appt.   Per Epic patient missed his lab appt on 05/13/24 and his appt with Dr. Sherrilee on 05/20/24.   Referral added to media due to it pertaining only the most recent office notes and labs from visit with pcp.   I sent a secure chat to the front office to make them aware I called and left a message advising the patient we received the referrral and for him to contact the office to reschedule his two missed appts.

## 2024-05-22 ENCOUNTER — Encounter (INDEPENDENT_AMBULATORY_CARE_PROVIDER_SITE_OTHER): Payer: Self-pay | Admitting: Gastroenterology

## 2024-05-28 ENCOUNTER — Telehealth: Payer: Self-pay | Admitting: Urology

## 2024-05-28 NOTE — Telephone Encounter (Signed)
 Needs appt for left testicle pain. He missed last appt due to government shut down with the [employer]

## 2024-05-29 NOTE — Telephone Encounter (Signed)
 Patient scheduled for 10/27

## 2024-06-03 ENCOUNTER — Ambulatory Visit (INDEPENDENT_AMBULATORY_CARE_PROVIDER_SITE_OTHER): Admitting: Urology

## 2024-06-03 VITALS — BP 124/78 | HR 98

## 2024-06-03 DIAGNOSIS — N5201 Erectile dysfunction due to arterial insufficiency: Secondary | ICD-10-CM | POA: Diagnosis not present

## 2024-06-03 DIAGNOSIS — N50819 Testicular pain, unspecified: Secondary | ICD-10-CM | POA: Diagnosis not present

## 2024-06-03 DIAGNOSIS — E291 Testicular hypofunction: Secondary | ICD-10-CM | POA: Diagnosis not present

## 2024-06-03 DIAGNOSIS — N200 Calculus of kidney: Secondary | ICD-10-CM

## 2024-06-03 MED ORDER — TESTOSTERONE CYPIONATE 200 MG/ML IM SOLN: 200.0000 mg | INTRAMUSCULAR | 3 refills | Status: AC

## 2024-06-03 MED ORDER — SYRINGE/NEEDLE (DISP) 23G X 1" 3 ML MISC: 1.0000 | 11 refills | Status: AC | PRN

## 2024-06-03 MED ORDER — TADALAFIL 20 MG PO TABS: 20.0000 mg | ORAL_TABLET | ORAL | 0 refills | Status: AC | PRN

## 2024-06-03 MED ORDER — TADALAFIL 5 MG PO TABS: 5.0000 mg | ORAL_TABLET | Freq: Every day | ORAL | 11 refills | Status: AC

## 2024-06-03 MED ORDER — DOXYCYCLINE HYCLATE 100 MG PO CAPS: 100.0000 mg | ORAL_CAPSULE | Freq: Two times a day (BID) | ORAL | 0 refills | Status: AC

## 2024-06-03 MED ORDER — ALFUZOSIN HCL ER 10 MG PO TB24: 10.0000 mg | ORAL_TABLET | Freq: Every day | ORAL | 11 refills | Status: AC

## 2024-06-03 MED ORDER — NEEDLE (DISP) 18G X 1-1/2" MISC: 1.0000 | 11 refills | Status: AC | PRN

## 2024-06-03 NOTE — Progress Notes (Unsigned)
 06/03/2024 9:33 AM   Lonni DELENA Meissner [birth year] [id number]  Referring provider: Lari Elspeth BRAVO, MD [address] [place],  [place] [address]     HPI: Mr Lofaro is a 48yo here for followup for hypogonadism, erectile dysfunction and weak urinary stream. He has intermittent right testicular pain which is worse with activity. Testosterone  779, hemoglobin 16.9. He is injecting IM testosterone  100mg  twice weekly. Energy is good. He uses tadalafil  5mg  daily and prn with mixed results.    PMH: Past Medical History:  Diagnosis Date   GERD (gastroesophageal reflux disease)    Kidney stone    Plantar fasciitis     Surgical History: Past Surgical History:  Procedure Laterality Date   BIOPSY  04/12/2022   Procedure: BIOPSY;  Surgeon: Eartha Angelia Sieving, MD;  [facility]: [facility];  Service: Gastroenterology;;   COLONOSCOPY WITH PROPOFOL  N/A 04/12/2022   Procedure: COLONOSCOPY WITH PROPOFOL ;  Surgeon: Eartha Angelia Sieving, MD;  Location: [facility];  Service: Gastroenterology;  Laterality: N/A;  150 ASA 2   CYSTOSCOPY     ESOPHAGOGASTRODUODENOSCOPY (EGD) WITH PROPOFOL  N/A 04/12/2022   Procedure: ESOPHAGOGASTRODUODENOSCOPY (EGD) WITH PROPOFOL ;  Surgeon: Eartha Angelia Sieving, MD;  Location: [facility];  Service: Gastroenterology;  Laterality: N/A;   POLYPECTOMY  04/12/2022   Procedure: POLYPECTOMY;  Surgeon: Eartha Angelia Sieving, MD;  Location: [facility];  Service: Gastroenterology;;   SHOULDER SURGERY Right    dec 2022   TONSILLECTOMY      Home Medications:  Allergies as of 06/03/2024   No Known Allergies      Medication List        Accurate as of June 03, 2024  9:33 AM. If you have any questions, ask your nurse or doctor.          acetaminophen 325 MG tablet Commonly known as: TYLENOL Take 650 mg by mouth every 6 (six) hours as needed.   alfuzosin  10 MG 24 hr tablet Commonly known as: UROXATRAL  Take 1 tablet (10 mg total) by mouth at  bedtime.   amphetamine-dextroamphetamine 10 MG tablet Commonly known as: ADDERALL Take by mouth.   aspirin 81 MG chewable tablet Chew 81 mg by mouth 2 (two) times daily.   celecoxib 200 MG capsule Commonly known as: CELEBREX Take by mouth.   Cyanocobalamin 1000 MCG/ML Kit Inject as directed every 30 (thirty) days.   dicyclomine  10 MG capsule Commonly known as: BENTYL  Take 1 capsule (10 mg total) by mouth every 12 (twelve) hours as needed.   diltiazem 120 MG 24 hr capsule Commonly known as: CARDIZEM CD Take 120 mg by mouth daily.   ergocalciferol 1.25 MG (50000 UT) capsule Commonly known as: VITAMIN D2 Take 50,000 Units by mouth once a week. Q friday   hydrOXYzine  10 MG tablet Commonly known as: ATARAX  Take 1 tablet (10 mg total) by mouth at bedtime.   indapamide 1.25 MG tablet Commonly known as: LOZOL Take 1.25 mg by mouth daily.   losartan 100 MG tablet Commonly known as: COZAAR Take 100 mg by mouth.   meloxicam 15 MG tablet Commonly known as: MOBIC Take 15 mg by mouth daily as needed for pain.   multivitamin capsule Take 1 capsule by mouth daily.   Pancrelipase (Lip-Prot-Amyl) 24000-76000 units Cpep Take 24,000 Units by mouth.   pantoprazole  40 MG tablet Commonly known as: PROTONIX  Take 1 tablet (40 mg total) by mouth daily before breakfast.   sulfamethoxazole -trimethoprim  800-160 MG tablet Commonly known as: BACTRIM  DS Take  1 tablet by mouth every 12 (twelve) hours.   tadalafil  5 MG tablet Commonly known as: CIALIS  Take 1 tablet (5 mg total) by mouth daily.   tadalafil  20 MG tablet Commonly known as: CIALIS  TAKE 1 TABLET BY MOUTH AS NEEDED   tamsulosin  0.4 MG Caps capsule Commonly known as: FLOMAX  Take 1 capsule (0.4 mg total) by mouth at bedtime.   testosterone  cypionate 200 MG/ML injection Commonly known as: DEPOTESTOSTERONE CYPIONATE Inject 0.5 mLs (100 mg total) into the muscle every 7 (seven) days.   Zepbound 2.5 MG/0.5ML Pen Generic  drug: tirzepatide Inject 2.5 mg into the skin once a week.        Allergies: No Known Allergies  Family History: Family History  Problem Relation Age of Onset   Colonic polyp Father    Hypertension Father    Colon polyps Sister     Social History:  reports that he has never smoked. He has been exposed to tobacco smoke. He has never used smokeless tobacco. He reports that he does not drink alcohol and does not use drugs.  ROS: All other review of systems were reviewed and are negative except what is noted above in HPI  Physical Exam: BP 124/78   Pulse 98   Constitutional:  Alert and oriented, No acute distress. HEENT: [place] AT, moist mucus membranes.  Trachea midline, no masses. Cardiovascular: No clubbing, cyanosis, or edema. Respiratory: Normal respiratory effort, no increased work of breathing. GI: Abdomen is soft, nontender, nondistended, no abdominal masses GU: No CVA tenderness.  Lymph: No cervical or inguinal lymphadenopathy. Skin: No rashes, bruises or suspicious lesions. Neurologic: Grossly intact, no focal deficits, moving all 4 extremities. Psychiatric: Normal mood and affect.  Laboratory Data: Lab Results  Component Value Date   WBC 11.6 (H) 02/20/2024   HGB 16.9 02/20/2024   HCT 52.0 (H) 02/20/2024   MCV 92 02/20/2024   PLT 386 02/20/2024    Lab Results  Component Value Date   CREATININE 1.25 02/20/2024    No results found for: PSA  Lab Results  Component Value Date   TESTOSTERONE  885 02/20/2024    No results found for: HGBA1C  Urinalysis    Component Value Date/Time   APPEARANCEUR Clear 08/11/2023 1316   GLUCOSEU Negative 08/11/2023 1316   BILIRUBINUR Negative 08/11/2023 1316   PROTEINUR Negative 08/11/2023 1316   NITRITE Negative 08/11/2023 1316   LEUKOCYTESUR Negative 08/11/2023 1316    Lab Results  Component Value Date   LABMICR Comment 08/11/2023   WBCUA 0-5 11/05/2020   LABEPIT None seen 11/05/2020   BACTERIA None seen  11/05/2020    Pertinent Imaging:  Results for orders placed in visit on 03/08/22  Abdomen 1 view (KUB)  Narrative CLINICAL DATA:  History of kidney stones  EXAM: ABDOMEN - 1 VIEW  COMPARISON:  CT abdomen and pelvis 08/20/2020  FINDINGS: The bowel gas pattern is normal. No radio-opaque calculi or other significant radiographic abnormality are seen. The 7 mm left renal calculus on CT 08/20/2020 is not confidently visualized.  IMPRESSION: Negative.   Electronically Signed By: Norman Gatlin M.D. On: 03/08/2022 18:49  No results found for this or any previous visit.  No results found for this or any previous visit.  No results found for this or any previous visit.  No results found for this or any previous visit.  No results found for this or any previous visit.  No results found for this or any previous visit.  Results for orders placed in  visit on 08/11/23  CT RENAL STONE STUDY  Narrative CLINICAL DATA:  Kidney stones, symptomatic.  EXAM: CT ABDOMEN AND PELVIS WITHOUT CONTRAST  TECHNIQUE: Multidetector CT imaging of the abdomen and pelvis was performed following the standard protocol without IV contrast.  RADIATION DOSE REDUCTION: This exam was performed according to the departmental dose-optimization program which includes automated exposure control, adjustment of the mA and/or kV according to patient size and/or use of iterative reconstruction technique.  COMPARISON:  04/14/2022  FINDINGS: Lower chest: No acute abnormality.  Hepatobiliary: No focal liver abnormality is seen. No gallstones, gallbladder wall thickening, or biliary dilatation.  Pancreas: Unremarkable. No pancreatic ductal dilatation or surrounding inflammatory changes.  Spleen: Normal in size without focal abnormality.  Adrenals/Urinary Tract: Normal adrenal glands. No right kidney stones or hydronephrosis. There are 2 stones within the lower pole collecting system of the left  kidney which measure up to 3 mm, image 62/5 and image 66/5. No hydronephrosis identified. No kidney mass. No hydroureter or ureteral calculi, bilaterally. Urinary bladder appears partially decompressed. No bladder calculi noted.  Stomach/Bowel: Stomach is normal. The appendix is visualized and appears normal. No pathologic dilatation of the large or small bowel loops. Scattered colonic diverticula noted within the sigmoid colon. No acute diverticulitis.  Vascular/Lymphatic: No significant vascular findings are present. No enlarged abdominal or pelvic lymph nodes.  Reproductive: No mass identified.  Other: No free fluid or fluid collections. No signs of pneumoperitoneum.  Musculoskeletal: No acute or significant osseous findings.  IMPRESSION: 1. No acute findings within the abdomen or pelvis. 2. Nonobstructing left renal calculi. 3. Sigmoid diverticulosis without evidence for acute diverticulitis.   Electronically Signed By: Waddell Calk M.D. On: 08/27/2023 16:00   Assessment & Plan:    1. Pain in testicle, unspecified laterality (Primary) Doxycycline 100mg  BID for 7 days  2. Hypogonadism male Im testosterone  100mg   Followup 6 months with testosterone  labs  3. Erectile dysfunction due to arterial insufficiency Tadalafil  5mg  daily and tadalafil  20mg  prn     No follow-ups on file.  Belvie Clara, MD  [facility] [place]

## 2024-06-04 ENCOUNTER — Encounter: Payer: Self-pay | Admitting: Urology

## 2024-06-04 NOTE — Patient Instructions (Signed)

## 2024-06-26 ENCOUNTER — Telehealth: Payer: Self-pay

## 2024-06-26 DIAGNOSIS — E291 Testicular hypofunction: Secondary | ICD-10-CM

## 2024-06-26 NOTE — Telephone Encounter (Signed)
 Pt called in  [facility] sky MD wrote RX  for clomid  50 mg  take 1/2 tablet every other day. Pt state's he want to know if DR. McKenzie write RX for the Clomid . Contact pharmacy about syringe and needles. Pharmacy state's it will be cheaper to purchased over the counter.

## 2024-07-02 MED ORDER — CLOMIPHENE CITRATE 50 MG PO TABS: 50.0000 mg | ORAL_TABLET | Freq: Every day | ORAL | Status: AC

## 2024-07-02 NOTE — Addendum Note (Signed)
 Addended by: GRETTA MASTERS R on: 07/02/2024 12:45 PM   Modules accepted: Orders

## 2024-07-02 NOTE — Telephone Encounter (Signed)
 Pt made aware and voiced understanding Yes please send in clomid 

## 2024-08-28 ENCOUNTER — Telehealth: Payer: Self-pay

## 2024-08-28 NOTE — Telephone Encounter (Signed)
 Pt called in today to give updated insurance information. Pt state's he need a PA for his testosterone  medication. Pt is advised once we received the PA we will send over to our PA team to completed his PA for medication. Pt voiced understanding

## 2024-08-29 ENCOUNTER — Other Ambulatory Visit (HOSPITAL_COMMUNITY): Payer: Self-pay

## 2024-08-29 ENCOUNTER — Telehealth: Payer: Self-pay

## 2024-08-29 NOTE — Telephone Encounter (Signed)
 Pharmacy Patient Advocate Encounter   Received notification from [facility] that prior authorization for Testosterone  is required/requested.   Insurance verification completed.   The patient is insured through [employer].   Per test claim: PA required; PA submitted to above mentioned insurance via Latent Key/confirmation #/EOC [id number] Status is pending

## 2024-09-02 NOTE — Telephone Encounter (Signed)
 Pharmacy Patient Advocate Encounter  Received notification from [employer] that Prior Authorization for Testosterone  has been DENIED.  Full denial letter will be uploaded to the media tab. See denial reason below.   PA #/Case ID/Reference #: [id number]   Must be prescribed by endocrinologist or urologist with diagnosis of hypogonadism based on consistently low serum testosterone  levels.

## 2024-09-11 ENCOUNTER — Telehealth: Payer: Self-pay

## 2024-09-11 NOTE — Telephone Encounter (Signed)
 SABRA

## 2024-10-07 ENCOUNTER — Encounter (INDEPENDENT_AMBULATORY_CARE_PROVIDER_SITE_OTHER): Admitting: Ophthalmology

## 2024-11-05 ENCOUNTER — Other Ambulatory Visit

## 2024-11-15 ENCOUNTER — Ambulatory Visit: Admitting: Urology
